# Patient Record
Sex: Male | Born: 1960 | Race: White | Hispanic: No | Marital: Married | Smoking: Former smoker
Health system: Southern US, Community
[De-identification: ages and names within clinical notes are randomized; demographics above are authoritative.]

## PROBLEM LIST (undated history)

## (undated) DIAGNOSIS — E7800 Pure hypercholesterolemia, unspecified: Secondary | ICD-10-CM

## (undated) DIAGNOSIS — I10 Essential (primary) hypertension: Secondary | ICD-10-CM

## (undated) DIAGNOSIS — E119 Type 2 diabetes mellitus without complications: Secondary | ICD-10-CM

## (undated) HISTORY — PX: THYROIDECTOMY: SHX17

## (undated) HISTORY — PX: TONSILLECTOMY: SUR1361

---

## 2005-05-17 ENCOUNTER — Ambulatory Visit: Payer: Self-pay | Admitting: Otolaryngology

## 2005-06-04 ENCOUNTER — Other Ambulatory Visit: Payer: Self-pay

## 2005-06-11 ENCOUNTER — Ambulatory Visit: Payer: Self-pay | Admitting: Otolaryngology

## 2005-06-18 ENCOUNTER — Ambulatory Visit: Payer: Self-pay | Admitting: Otolaryngology

## 2005-07-30 ENCOUNTER — Ambulatory Visit: Payer: Self-pay | Admitting: Otolaryngology

## 2005-09-04 ENCOUNTER — Inpatient Hospital Stay: Payer: Self-pay | Admitting: Endocrinology

## 2006-12-19 IMAGING — CR DG CHEST 2V
1 series · 2 of 2 positions shown · non-contrast
Comparison: none

REASON FOR EXAM: neck mass
COMMENTS:

[Series 1653: postero_anterior · 0.11mm/px · 2 of 2 slices shown]
[im 1/2]
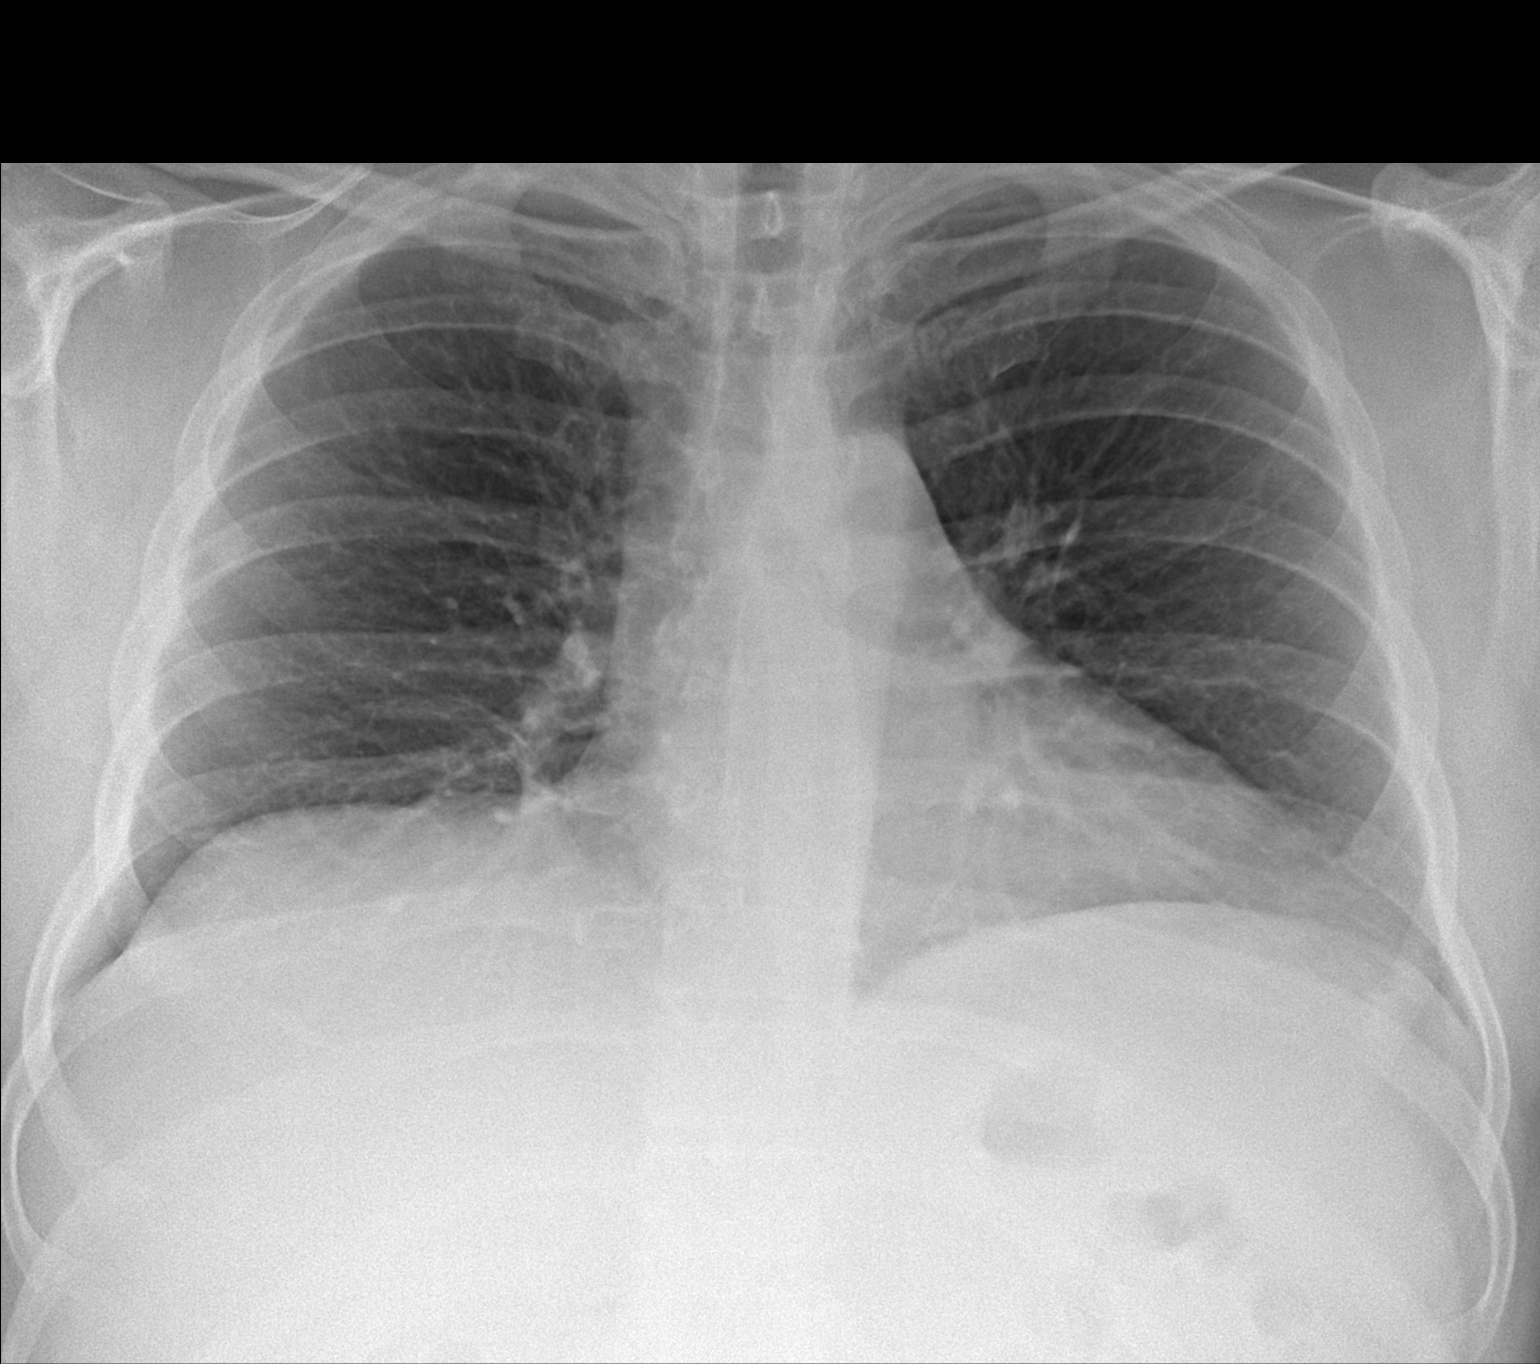
[im 2/2]
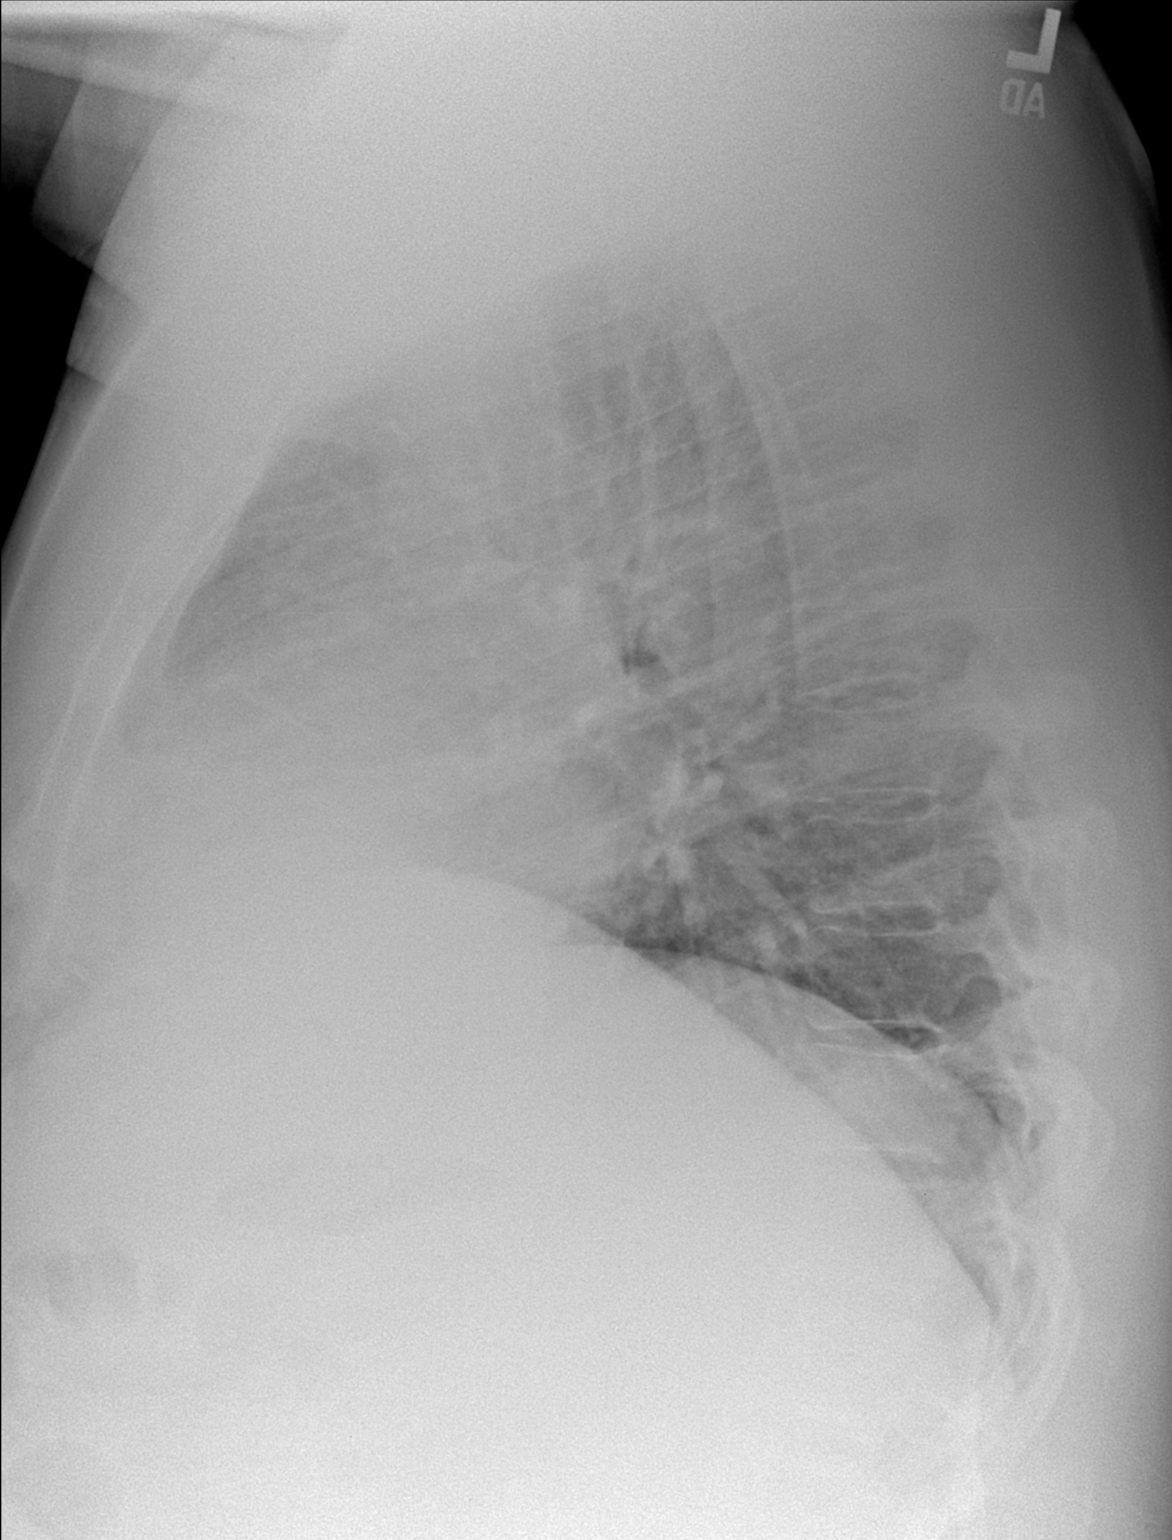

[2 of 2 positions shown; findings below may reference images not displayed]

PROCEDURE:     DXR - DXR CHEST PA (OR AP) AND LATERAL  - June 04, 2005  [DATE]

RESULT:     The lung fields are clear. No pneumonia, pneumothorax or pleural
effusion is seen. No mass or definite adenopathy is identified on plain film
examination. Heart size is normal. No significant osseous abnormalities are
noted.
IMPRESSION: 1)No significant abnormalities are identified.

## 2007-01-02 IMAGING — NM NM THYROID IMAGING W/ UPTAKE SINGLE (24 HR)
1 series · 3 of 3 positions shown · non-contrast
Comparison: none

REASON FOR EXAM: Neck CA. Evaluate for thyroid lesion and any metastatic
focus elsewhere in neck
COMMENTS:

[Series 0: thyroid · 2.4mm · 2.40mm/px · 3 of 3 frames shown]
[frame 1/3  full-range]
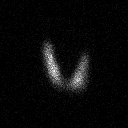
[frame 2/3  full-range]
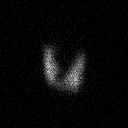
[frame 3/3  full-range]
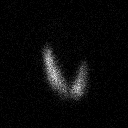

[3 of 3 positions shown; findings below may reference images not displayed]

PROCEDURE:     NM  - NM THYROID I-OLM 24 HR [DATE]  [DATE]

RESULT:     The patient received 139 microcuries of I-OLM orally for this
study.

The six-hour uptake is 11% and the 23-hour uptake is 22%.  The RIGHT thyroid
lobe appears to be slightly larger than the LEFT.  No hot or discrete cold
lesions are identified.  Within the visualized portions of the neck no
evidence of ectopic thyroid activity or metastatic foci is seen.
IMPRESSION: There is a normal I-OLM thyroid uptake and scan.  No findings are seen to
suggest ectopic thyroid tissue or metastatic disease within the visualized
portions of the neck.  The study received intradepartmental review.

## 2013-10-14 ENCOUNTER — Emergency Department: Payer: Self-pay | Admitting: Emergency Medicine

## 2013-10-14 LAB — COMPREHENSIVE METABOLIC PANEL
ALT: 21 U/L (ref 12–78)
ANION GAP: 6 — AB (ref 7–16)
Albumin: 4.4 g/dL (ref 3.4–5.0)
Alkaline Phosphatase: 65 U/L
BILIRUBIN TOTAL: 0.4 mg/dL (ref 0.2–1.0)
BUN: 16 mg/dL (ref 7–18)
CALCIUM: 9.7 mg/dL (ref 8.5–10.1)
Chloride: 104 mmol/L (ref 98–107)
Co2: 31 mmol/L (ref 21–32)
Creatinine: 1.14 mg/dL (ref 0.60–1.30)
EGFR (African American): >60
EGFR (Non-African Amer.): >60
Glucose: 80 mg/dL (ref 65–99)
OSMOLALITY: 281 (ref 275–301)
Potassium: 3.4 mmol/L — ABNORMAL LOW (ref 3.5–5.1)
SGOT(AST): 17 U/L (ref 15–37)
Sodium: 141 mmol/L (ref 136–145)
Total Protein: 8.2 g/dL (ref 6.4–8.2)

## 2013-10-14 LAB — URINALYSIS, COMPLETE
BLOOD: NEGATIVE
Bilirubin,UR: NEGATIVE
Glucose,UR: NEGATIVE mg/dL (ref 0–75)
Nitrite: NEGATIVE
PROTEIN: NEGATIVE
Ph: 5 (ref 4.5–8.0)
RBC,UR: 1 /HPF (ref 0–5)
Specific Gravity: 1.026 (ref 1.003–1.030)
Squamous Epithelial: <1
WBC UR: 5 /HPF (ref 0–5)

## 2013-10-14 LAB — CBC
HCT: 40.7 % (ref 40.0–52.0)
HGB: 13.8 g/dL (ref 13.0–18.0)
MCH: 30.3 pg (ref 26.0–34.0)
MCHC: 33.9 g/dL (ref 32.0–36.0)
MCV: 90 fL (ref 80–100)
Platelet: 261 10*3/uL (ref 150–440)
RBC: 4.55 10*6/uL (ref 4.40–5.90)
RDW: 14.1 % (ref 11.5–14.5)
WBC: 8.9 10*3/uL (ref 3.8–10.6)

## 2013-10-14 LAB — DRUG SCREEN, URINE
Amphetamines, Ur Screen: NEGATIVE (ref ?–1000)
Barbiturates, Ur Screen: NEGATIVE (ref ?–200)
Benzodiazepine, Ur Scrn: NEGATIVE (ref ?–200)
CANNABINOID 50 NG, UR ~~LOC~~: NEGATIVE (ref ?–50)
Cocaine Metabolite,Ur ~~LOC~~: POSITIVE (ref ?–300)
MDMA (ECSTASY) UR SCREEN: NEGATIVE (ref ?–500)
METHADONE, UR SCREEN: NEGATIVE (ref ?–300)
Opiate, Ur Screen: NEGATIVE (ref ?–300)
Phencyclidine (PCP) Ur S: NEGATIVE (ref ?–25)
TRICYCLIC, UR SCREEN: NEGATIVE (ref ?–1000)

## 2013-10-14 LAB — ETHANOL: Ethanol: <3 mg/dL

## 2013-10-14 LAB — SALICYLATE LEVEL: Salicylates, Serum: 1.9 mg/dL

## 2013-10-14 LAB — ACETAMINOPHEN LEVEL: Acetaminophen: <2 ug/mL

## 2013-12-22 DIAGNOSIS — E1122 Type 2 diabetes mellitus with diabetic chronic kidney disease: Secondary | ICD-10-CM | POA: Insufficient documentation

## 2013-12-22 DIAGNOSIS — E782 Mixed hyperlipidemia: Secondary | ICD-10-CM | POA: Insufficient documentation

## 2014-12-24 NOTE — Consult Note (Signed)
Brief Consult Note: Diagnosis: cocaine dependence.   Patient was seen by consultant.   Consult note dictated.   Discussed with Attending MD.   Comments: Psychiartry: PAtient seen and chart reviewed. Patient Addicted to cocaine. Denies any suicidal ideation and denies homicidal ideation and is calm and lucid. Not delirious or psychotic. Medically stable. Not commitable and not requiring inpatient treatment. Will refer to outpt SA treatment. DC the IVC.  Electronic Signatures: Clapacs, Jackquline DenmarkJohn T (MD)  (Signed 13-Feb-15 15:20)  Authored: Brief Consult Note   Last Updated: 13-Feb-15 15:20 by Audery Amellapacs, John T (MD)

## 2014-12-24 NOTE — Consult Note (Signed)
PATIENT NAME:  Randy Marshall, HARB[facility] MR#:  [id number] DATE OF BIRTH:  13-Dec-1960  DATE OF CONSULTATION:  10/15/2013  REFERRING PHYSICIAN:   CONSULTING PHYSICIAN:  Audery Amel, MD  IDENTIFYING INFORMATION AND REASON FOR CONSULTATION:  This is a 54 year old man with a history of cocaine dependence brought to the hospital under involuntary commitment.  Consultation for appropriate psychiatric disposition.   HISTORY OF PRESENT ILLNESS:  The patient's chief complaint to me is "I don't know why I'm here."  He was brought to the Emergency Room under involuntary commitment paperwork taken out by his wife.  The commitment paperwork simply says that he had made threats to himself and to his family.  Little other detail available.  I have not been able to reach his wife on the phone.  The patient admits that he abuses crack cocaine.  He tends to minimize it, however.  He says he smokes cocaine only occasionally, but admits that he had a binge recently.  He says that his wife has been after him to stop for quite some time and has frequently threatened that she was going to call the police and have him brought in.  The patient tends to blame his wife for his problems and says that he is very irritated that she so unrealistically thinks that a doctor can treat his cocaine addiction.  He totally denies any suicidal or homicidal ideation and scoffs at the idea that he has been threatening to anyone in his family.  Denies feeling depressed.  Denies any psychotic symptoms.   PAST PSYCHIATRIC HISTORY:  The patient has a history of cocaine dependence intermittently going back years.  He otherwise does not have any clear history of psychiatric treatment.  No psychiatric hospitalizations.  Denies any history of suicide attempts.  Denies any history of violence to others.  Not on any psychiatric medicine.   FAMILY HISTORY:  Apparently there was a brother who has a psychotic disorder.   SOCIAL HISTORY:  The patient is married,  has two young children.  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  Evidently that does not keep him very busy.  His wife also does not work and gets disability which seems to be what supports the family.  The patient sounds like he runs around with a lot of other people who are using drugs or getting in trouble frequently.   SUBSTANCE ABUSE HISTORY:  Intermittent abuse of cocaine going back years.  He has apparently never made any serious attempt to stay sober.  About the most he can come up with is that occasionally he has thought about stopping.  He insists that any time he tries to stop someone will get to talking about cocaine with him which immediately makes him think about using again.  He uses this as an excuse for why he keeps relapsing and why he does not want to engage in any treatment.   PAST MEDICAL HISTORY:  No significant ongoing medical problems other than hypothyroidism, mild and possible borderline diabetes.  He is not very knowledgeable about his medication or his treatment.   REVIEW OF SYSTEMS:  The patient denies depression.  Denies anxiety attacks.  Denies suicidal or homicidal ideation.  Denies hallucinations.  Absolutely denies having any threatening thoughts or behaviors.  Physically, he says he has some aches and pains, but otherwise a 10 point review of systems is negative.   MENTAL STATUS EXAMINATION:  Disheveled man who looks his stated age.  Passively cooperative with the interview.  Eye contact intermittent.  Psychomotor activity a little bit fidgety.  Speech normal rate, tone and volume.  Affect is entitled, slightly irritable, not bizarre, however.  Mood stated as fine.  Thoughts are lucid without any loosening of associations or delusions.  He engages in a great deal of blaming trying to actually blame his cocaine addiction on his family and his wife at times.  No evidence of psychosis.  No suicidal or homicidal ideation.  Insight and judgment  poor.  Intelligence normal.  Alert and oriented x 4.  Short and long-term memory grossly intact, 3 out of 3 objects immediately and at three minutes and good fund of knowledge.   LABORATORY RESULTS:  Salicylates and acetaminophen negative.  Alcohol negative.  Chemistry panel nothing remarkable.  CBC unremarkable.  Urinalysis, a little ketones.  Drug screen positive for cocaine.   ASSESSMENT:  This is a 54 year old man with cocaine dependence.  At the time of my interview there is no evidence of depression or mania.  No evidence of psychosis.  He absolutely denies any threats or wishes to harm himself or anyone else in his family.  He is able to cite multiple positive things in his life to live for.  There is no evidence that he has done anything violent to anyone.  He is able to understand the consequences of bad or dangerous behavior.  At this point, he does not appear to meet commitment criteria and can be taken off commitment.  The patient does have a cocaine dependence problem, however.   TREATMENT PLAN:  Discontinue involuntary commitment.  I spent some time trying to do a little bit of supportive interviewing and counseling with him and encouragement about stopping cocaine.  I informed him that while there was no pill that would make him stop using cocaine, there certainly was treatment for cocaine dependence available.  The patient will be given referrals to local mental health centers where substance abuse treatment can be obtained.  He seems to be only partially interested in stopping using drugs.  Otherwise, I think he can be released reasonably from the Emergency Room.  The case discussed with Emergency Room attending, involuntary commitment discontinued.  No new medicines prescribed.   DIAGNOSIS, PRINCIPAL AND PRIMARY:  AXIS I:  Cocaine dependence.   SECONDARY DIAGNOSES: AXIS I:  No further.  AXIS II:  Deferred.  AXIS III:  Borderline diabetes, high blood pressure, hypothyroidism.  AXIS  IV:  Severe stress from financial strain from cocaine dependence.  AXIS V:  Functioning at time of evaluation 54.    ____________________________ Audery AmelJohn T. Jametta Moorehead, MD jtc:ea D: 10/16/2013 00:29:31 ET T: 10/16/2013 04:33:42 ET JOB#: [id number]  cc: Audery AmelJohn T. Bradshaw Minihan, MD, <Dictator> Audery AmelJOHN T Ishia Tenorio MD ELECTRONICALLY SIGNED 10/19/2013 11:47

## 2017-04-28 DIAGNOSIS — Z85850 Personal history of malignant neoplasm of thyroid: Secondary | ICD-10-CM | POA: Insufficient documentation

## 2018-07-03 DIAGNOSIS — B1710 Acute hepatitis C without hepatic coma: Secondary | ICD-10-CM | POA: Insufficient documentation

## 2018-07-06 DIAGNOSIS — Z79899 Other long term (current) drug therapy: Secondary | ICD-10-CM | POA: Insufficient documentation

## 2018-07-27 DIAGNOSIS — R7989 Other specified abnormal findings of blood chemistry: Secondary | ICD-10-CM | POA: Insufficient documentation

## 2018-07-28 ENCOUNTER — Other Ambulatory Visit: Payer: Self-pay | Admitting: Nurse Practitioner

## 2018-07-28 DIAGNOSIS — R945 Abnormal results of liver function studies: Principal | ICD-10-CM

## 2018-07-28 DIAGNOSIS — R7989 Other specified abnormal findings of blood chemistry: Secondary | ICD-10-CM

## 2018-07-29 ENCOUNTER — Ambulatory Visit: Admission: RE | Admit: 2018-07-29 | Payer: Commercial Managed Care - PPO | Source: Ambulatory Visit

## 2018-07-29 ENCOUNTER — Other Ambulatory Visit: Payer: Self-pay | Admitting: Gastroenterology

## 2018-07-29 DIAGNOSIS — R748 Abnormal levels of other serum enzymes: Secondary | ICD-10-CM

## 2018-08-04 ENCOUNTER — Encounter (INDEPENDENT_AMBULATORY_CARE_PROVIDER_SITE_OTHER): Payer: Self-pay

## 2018-08-04 ENCOUNTER — Ambulatory Visit
Admission: RE | Admit: 2018-08-04 | Discharge: 2018-08-04 | Disposition: A | Discharge status: Home / Self Care | Payer: Commercial Managed Care - PPO | Source: Ambulatory Visit | Attending: Gastroenterology | Admitting: Gastroenterology

## 2018-08-04 DIAGNOSIS — R748 Abnormal levels of other serum enzymes: Secondary | ICD-10-CM | POA: Insufficient documentation

## 2018-08-04 DIAGNOSIS — K7689 Other specified diseases of liver: Secondary | ICD-10-CM | POA: Insufficient documentation

## 2018-08-05 ENCOUNTER — Other Ambulatory Visit: Payer: Self-pay | Admitting: Gastroenterology

## 2018-08-05 DIAGNOSIS — R7989 Other specified abnormal findings of blood chemistry: Secondary | ICD-10-CM

## 2018-08-05 DIAGNOSIS — R945 Abnormal results of liver function studies: Principal | ICD-10-CM

## 2018-08-06 ENCOUNTER — Ambulatory Visit
Admission: RE | Admit: 2018-08-06 | Discharge: 2018-08-06 | Disposition: A | Discharge status: Home / Self Care | Payer: Commercial Managed Care - PPO | Source: Ambulatory Visit | Attending: Gastroenterology | Admitting: Gastroenterology

## 2018-08-06 DIAGNOSIS — R748 Abnormal levels of other serum enzymes: Secondary | ICD-10-CM | POA: Diagnosis not present

## 2018-08-06 DIAGNOSIS — R7989 Other specified abnormal findings of blood chemistry: Secondary | ICD-10-CM

## 2018-08-06 DIAGNOSIS — R945 Abnormal results of liver function studies: Secondary | ICD-10-CM

## 2018-09-04 DIAGNOSIS — R945 Abnormal results of liver function studies: Secondary | ICD-10-CM | POA: Diagnosis not present

## 2019-07-18 IMAGING — US US HEPATIC LIVER DOPPLER
1 series · 14 of 25 positions shown · non-contrast
Comparison: None.

CLINICAL DATA: 57-year-old male with history transaminitis

EXAM:
DUPLEX ULTRASOUND OF LIVER
TECHNIQUE: Color and duplex Doppler ultrasound was performed to evaluate the
hepatic in-flow and out-flow vessels.

[Series 1: us hepatic liver doppler · 0.26mm/px · 14 of 51 slices shown]
[im 1/51]
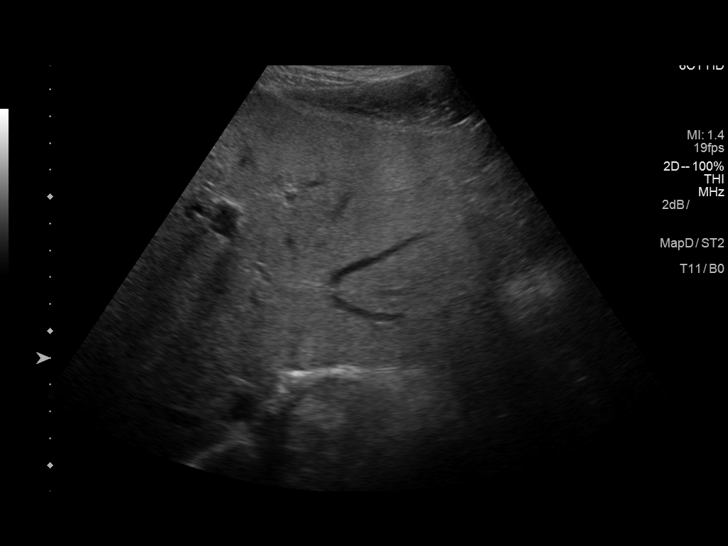
[im 5/51]
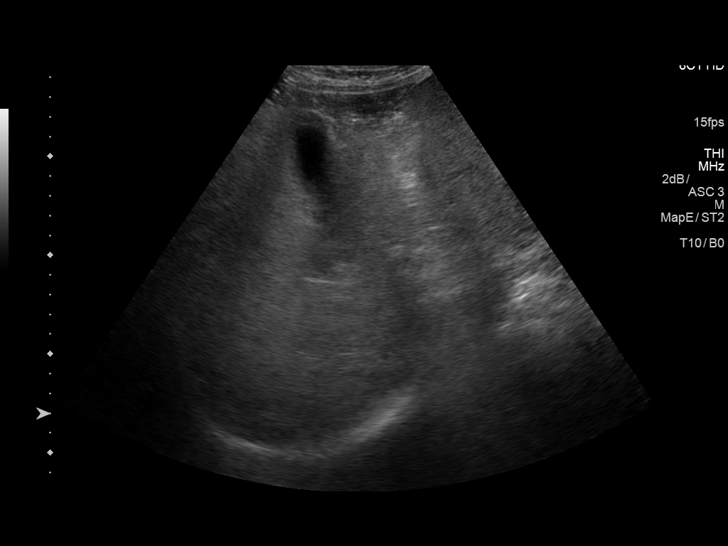
[im 9/51]
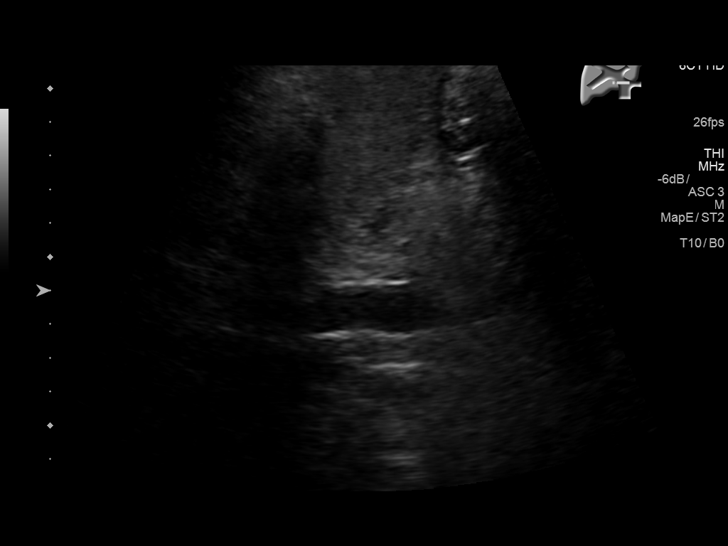
[im 13/51]
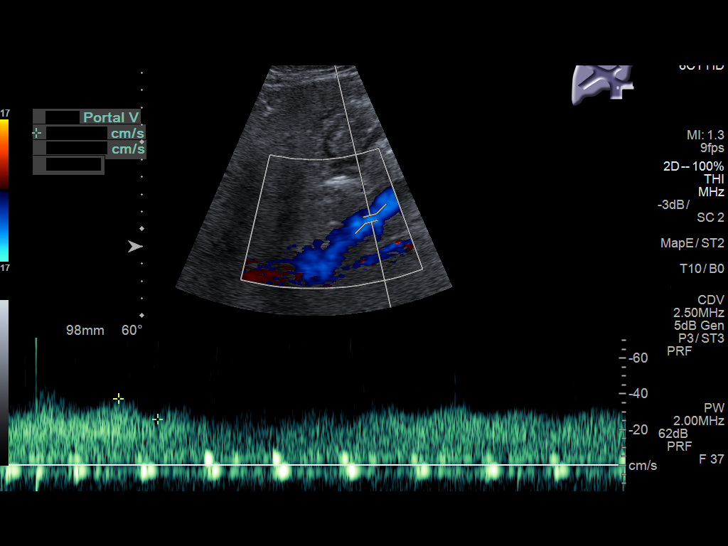
[im 17/51]
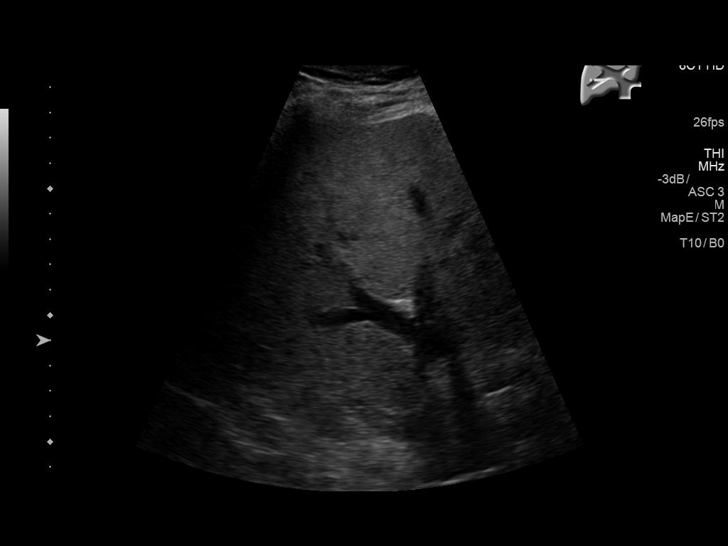
[im 19/51]
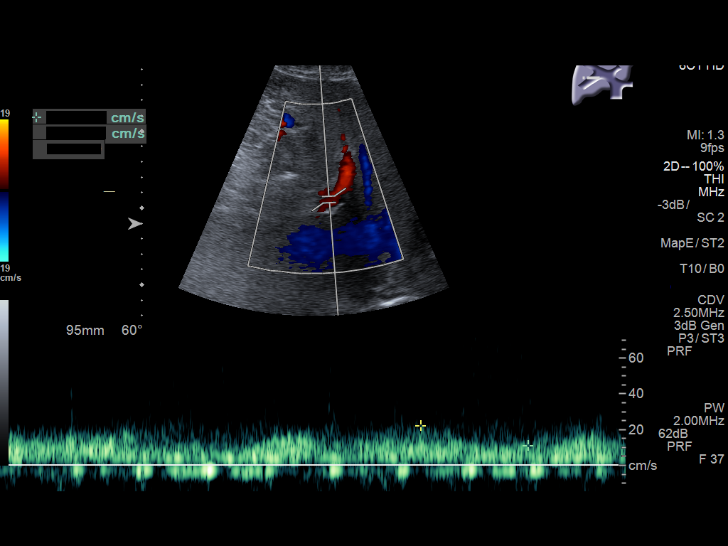
[im 23/51]
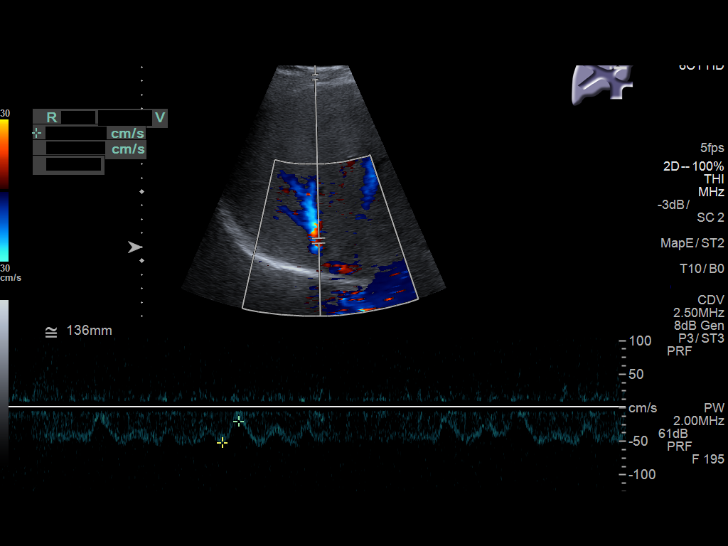
[im 28/51]
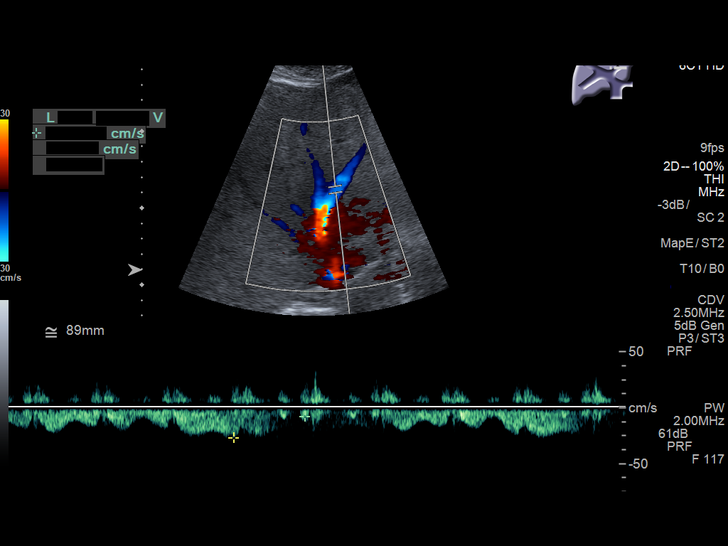
[im 32/51]
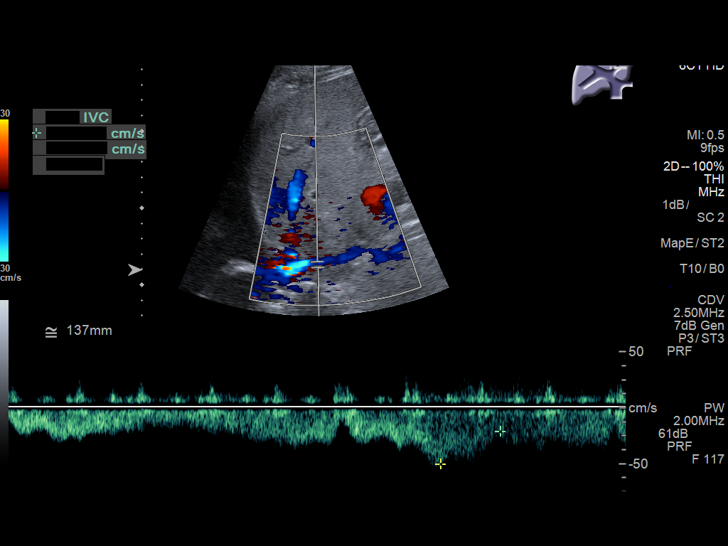
[im 34/51]
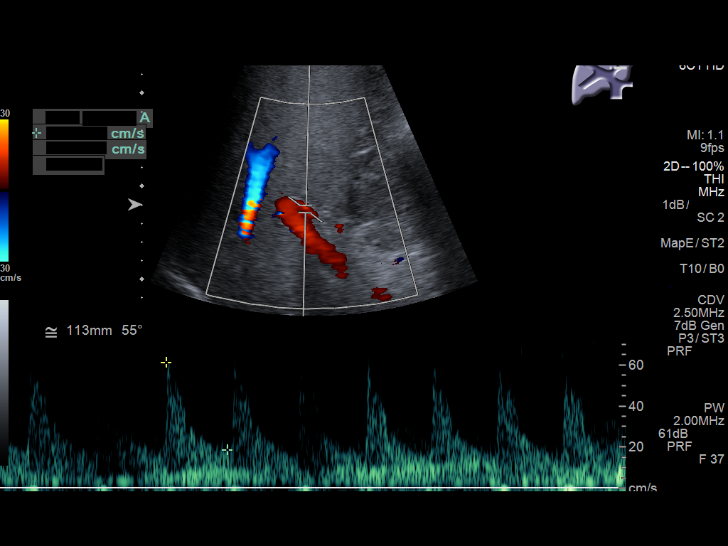
[im 38/51]
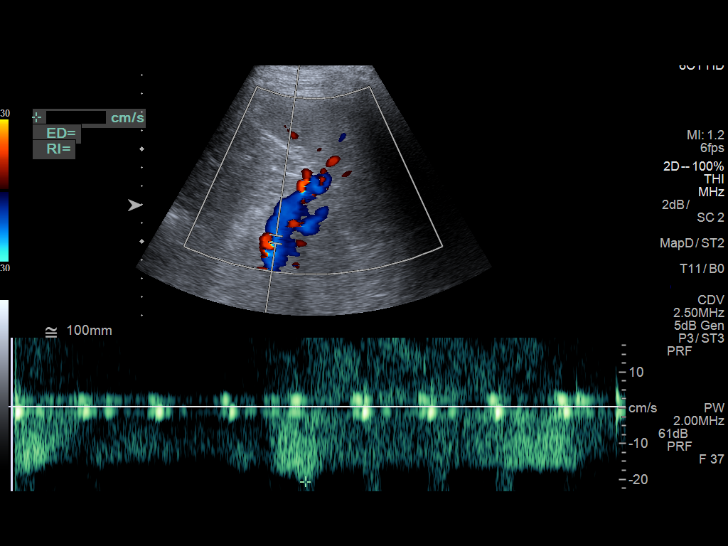
[im 42/51]
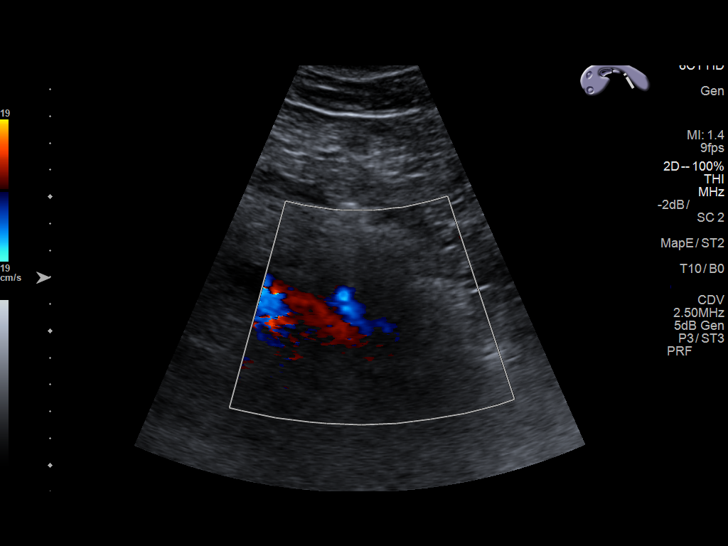
[im 46/51]
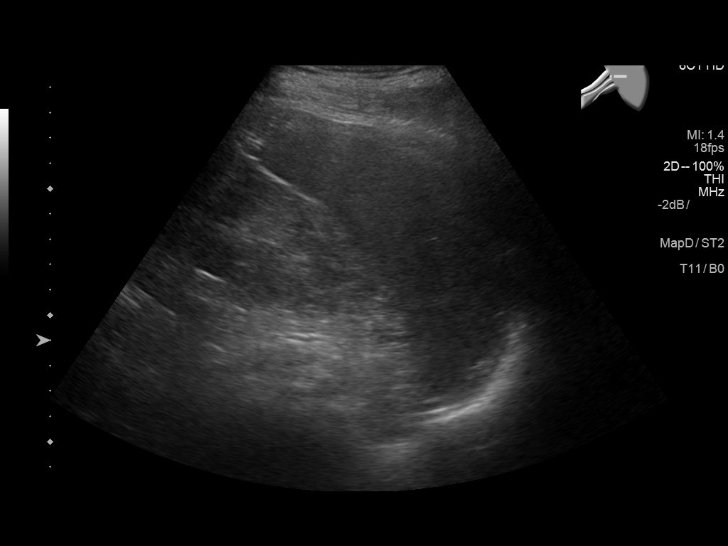
[im 51/51]
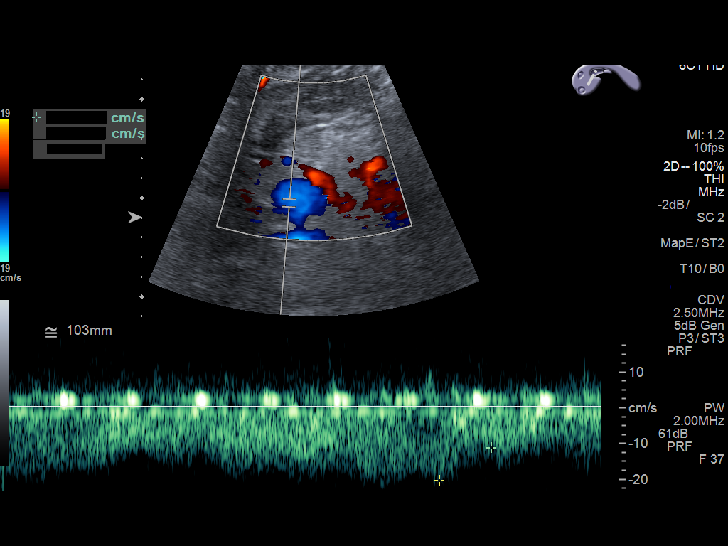

[14 of 25 positions shown; findings below may reference images not displayed]

FINDINGS: Portal Vein Velocities

Main:  37 cm/sec

Right:  27 cm/sec

Left:  28 cm/sec

Hepatic Vein Velocities

Right:  52 cm/sec

Middle:  28 cm/sec

Left:  27 cm/sec

Hepatic Artery Velocity:  61 cm/sec

Splenic Vein Velocity:  21 cm/sec

Varices: Absent

Ascites: Absent

Spleen estimated 202 cc

Cyst identified on prior ultrasound not visualized.

Heterogeneous echotexture of liver parenchyma with no nodular
contour.
IMPRESSION: Patent portal venous system with hepatopetal flow.

Heterogeneous appearance of liver parenchyma, which can be seen in
medical liver disease.

## 2020-12-19 ENCOUNTER — Ambulatory Visit
Admission: EM | Admit: 2020-12-19 | Discharge: 2020-12-19 | Disposition: A | Discharge status: Home / Self Care | Payer: Commercial Managed Care - PPO | Attending: Emergency Medicine | Admitting: Emergency Medicine

## 2020-12-19 ENCOUNTER — Encounter: Payer: Self-pay | Admitting: Emergency Medicine

## 2020-12-19 ENCOUNTER — Other Ambulatory Visit: Payer: Self-pay

## 2020-12-19 DIAGNOSIS — R339 Retention of urine, unspecified: Secondary | ICD-10-CM | POA: Insufficient documentation

## 2020-12-19 HISTORY — DX: Essential (primary) hypertension: I10

## 2020-12-19 HISTORY — DX: Type 2 diabetes mellitus without complications: E11.9

## 2020-12-19 HISTORY — DX: Pure hypercholesterolemia, unspecified: E78.00

## 2020-12-19 LAB — URINALYSIS, COMPLETE (UACMP) WITH MICROSCOPIC
Bilirubin Urine: NEGATIVE
Glucose, UA: 250 mg/dL — AB
Hgb urine dipstick: NEGATIVE
Ketones, ur: NEGATIVE mg/dL
Leukocytes,Ua: NEGATIVE
Nitrite: NEGATIVE
Protein, ur: NEGATIVE mg/dL
Specific Gravity, Urine: 1.02 (ref 1.005–1.030)
pH: 5.5 (ref 5.0–8.0)

## 2020-12-19 LAB — COMPREHENSIVE METABOLIC PANEL
ALT: 85 U/L — ABNORMAL HIGH (ref 0–44)
AST: 33 U/L (ref 15–41)
Albumin: 3.9 g/dL (ref 3.5–5.0)
Alkaline Phosphatase: 50 U/L (ref 38–126)
Anion gap: 8 (ref 5–15)
BUN: 23 mg/dL — ABNORMAL HIGH (ref 6–20)
CO2: 26 mmol/L (ref 22–32)
Calcium: 9.1 mg/dL (ref 8.9–10.3)
Chloride: 103 mmol/L (ref 98–111)
Creatinine, Ser: 0.85 mg/dL (ref 0.61–1.24)
GFR, Estimated: >60 mL/min (ref >60)
Glucose, Bld: 94 mg/dL (ref 70–99)
Potassium: 3.9 mmol/L (ref 3.5–5.1)
Sodium: 137 mmol/L (ref 135–145)
Total Bilirubin: 0.5 mg/dL (ref 0.3–1.2)
Total Protein: 7.6 g/dL (ref 6.5–8.1)

## 2020-12-19 MED ORDER — TAMSULOSIN HCL 0.4 MG PO CAPS: 0.4000 mg | ORAL_CAPSULE | Freq: Every day | ORAL | 0 refills | Status: DC

## 2020-12-19 NOTE — ED Provider Notes (Signed)
[facility]    CSN: [id number] Arrival date & time: 12/19/20  0850      History   Chief Complaint Chief Complaint  Patient presents with  . Urinary Retention    HPI Randy Marshall is a 60 y.o. male.   HPI   60 year old male here for evaluation of urinary symptoms.  Patient reports that for the last 3 to 4 days he has been having issues with urinary urgency, frequency, and a weak stream.  Patient reports that he will feel like he has to urinate and then only a trickle will come out.  He denies fever, chills, back pain, blood in his urine, cloudiness to his urine, painful urination, or any history of prostate issues in the past.  Patient does have a history of diabetes, hypertension, and CKD stage III.  Patient reports that his symptoms have become better today and he is wondering if he may be was not drinking enough water.  Past Medical History:  Diagnosis Date  . Diabetes mellitus without complication (HCC)   . Hypercholesteremia   . Hypertension     There are no problems to display for this patient.   Past Surgical History:  Procedure Laterality Date  . THYROIDECTOMY    . TONSILLECTOMY         Home Medications    Prior to Admission medications   Medication Sig Start Date End Date Taking? Authorizing Provider  amLODipine (NORVASC) 10 MG tablet Take by mouth. 11/28/20  Yes [provider]  Cholecalciferol 50 MCG (2000 UT) CAPS Take by mouth. 08/23/19  Yes [provider]  cyanocobalamin 1000 MCG tablet Take by mouth. 08/20/19  Yes [provider]  glimepiride (AMARYL) 1 MG tablet Take 1 mg by mouth 2 (two) times daily. 09/07/20  Yes [provider]  levothyroxine (SYNTHROID) 150 MCG tablet Take 150 mcg by mouth daily. 11/06/20  Yes [provider]  lisinopril (ZESTRIL) 40 MG tablet Take 40 mg by mouth daily. 11/06/20  Yes [provider]  metFORMIN (GLUCOPHAGE) 1000 MG tablet Take 1 tablet by mouth 2 (two)  times daily. 10/31/20  Yes [provider]  Omega-3 Fatty Acids (FISH OIL) 1000 MG CAPS Take by mouth.   Yes [provider]  rosuvastatin (CRESTOR) 40 MG tablet Take 40 mg by mouth daily. 10/31/20  Yes [provider]  tamsulosin (FLOMAX) 0.4 MG CAPS capsule Take 1 capsule (0.4 mg total) by mouth daily. 12/19/20  Yes Becky Augusta, NP    Family History History reviewed. No pertinent family history.  Social History Social History   Tobacco Use  . Smoking status: Former Games developer  . Smokeless tobacco: Never Used  Vaping Use  . Vaping Use: Never used  Substance Use Topics  . Alcohol use: Not Currently  . Drug use: Never     Allergies   Patient has no known allergies.   Review of Systems Review of Systems  Constitutional: Negative for activity change, appetite change, chills and fever.  Gastrointestinal: Negative for abdominal pain.  Genitourinary: Positive for decreased urine volume, difficulty urinating, frequency and urgency. Negative for dysuria, flank pain, hematuria and penile discharge.  Musculoskeletal: Negative for back pain.  Hematological: Negative.   Psychiatric/Behavioral: Negative.      Physical Exam Triage Vital Signs ED Triage Vitals [12/19/20 0901]  Enc Vitals Group     BP      Pulse      Resp      Temp  Temp src      SpO2      Weight      Height      Head Circumference      Peak Flow      Pain Score 0     Pain Loc      Pain Edu?      Excl. in GC?    No data found.  Updated Vital Signs BP (!) 161/84 (BP Location: Left Arm)   Pulse 87   Temp 99.3 F (37.4 C) (Oral)   Resp 18   Ht 5\' 7"  (1.702 m)   Wt 200 lb (90.7 kg)   SpO2 99%   BMI 31.32 kg/m   Visual Acuity Right Eye Distance:   Left Eye Distance:   Bilateral Distance:    Right Eye Near:   Left Eye Near:    Bilateral Near:     Physical Exam Vitals and nursing note reviewed.  Constitutional:      General: He is not in acute distress.     Appearance: Normal appearance. He is obese. He is not ill-appearing.  HENT:     Head: Normocephalic and atraumatic.  Cardiovascular:     Rate and Rhythm: Normal rate and regular rhythm.     Pulses: Normal pulses.     Heart sounds: Normal heart sounds. No murmur heard. No gallop.   Pulmonary:     Effort: Pulmonary effort is normal.     Breath sounds: Normal breath sounds. No wheezing, rhonchi or rales.  Abdominal:     General: Bowel sounds are normal. There is no distension.     Palpations: Abdomen is soft.     Tenderness: There is no abdominal tenderness. There is no right CVA tenderness, left CVA tenderness, guarding or rebound.  Genitourinary:    Prostate: Normal.     Rectum: Normal.  Skin:    General: Skin is warm and dry.     Capillary Refill: Capillary refill takes less than 2 seconds.     Findings: No erythema or rash.  Neurological:     General: No focal deficit present.     Mental Status: He is alert and oriented to person, place, and time.  Psychiatric:        Mood and Affect: Mood normal.        Behavior: Behavior normal.        Thought Content: Thought content normal.        Judgment: Judgment normal.      UC Treatments / Results  Labs (all labs ordered are listed, but only abnormal results are displayed) Labs Reviewed  URINALYSIS, COMPLETE (UACMP) WITH MICROSCOPIC - Abnormal; Notable for the following components:      Result Value   Glucose, UA 250 (*)    Bacteria, UA RARE (*)    All other components within normal limits  COMPREHENSIVE METABOLIC PANEL - Abnormal; Notable for the following components:   BUN 23 (*)    ALT 85 (*)    All other components within normal limits    EKG   Radiology No results found.  Procedures Procedures (including critical care time)  Medications Ordered in UC Medications - No data to display  Initial Impression / Assessment and Plan / UC Course  I have reviewed the triage vital signs and the nursing  notes.  Pertinent labs & imaging results that were available during my care of the patient were reviewed by me and considered in my medical decision making (see  chart for details).   Patient is a very pleasant 60 year old male here for evaluation of urinary symptoms that been going on for last 3 to 4 days.  He ports that he feels like he has to urinate but has difficulty starting his stream, stream is weak, and only a little bit will come out.  He does not report any feelings of incomplete emptying but he does report urgency and frequency of urination.  He denies fever, hematuria, cloudiness to his urine, or pain with urination.  Patient states he has not had any prostate issues in the past but he does have CKD stage III.  Patient has mild dyspnea on exam but his lungs are clear to auscultation all fields.  Heart sounds are S1-S2 without murmur, rub, or ectopy.  Abdomen is protuberant but soft with positive bowel sounds in all 4 quadrants.  Patient has no tenderness to palpation.  Unable to palpate bladder or detect any bladder distention.  Rectal exam reveals a nontender, nonswollen prostate.  No gross blood.  No hemorrhoids noted on exam.  Will check urinalysis for signs of infection.  If urine is negative will check renal function.  UA shows a glucose of 250 and rare bacteria but is otherwise unremarkable.  Will check CMP.  CMP showed no electrolyte abnormality, BUN is 23 and creatinine is 0.85.  There is no signs of AKI or impaired renal function and there is no signs of urinary tract infection based on lab work.  Patient's prostate exam does not reveal any tenderness or significant enlargement.  We will do a trial of tamsulosin 0.4 mg daily to see if patient has an improvement in his symptoms.  Patient's calculated creatinine clearance is 119 mL/min so there is no dosage adjustment required.   Final Clinical Impressions(s) / UC Diagnoses   Final diagnoses:  Urinary retention     Discharge  Instructions     Your lab work today did not show any signs of urinary tract infection, or decreased to your kidney function.  Your physical exam did not reveal the presence of a tender prostate and I do not suspect prostatitis but I do suspect that you have some mild enlargement of your prostate which is leading to your urinary symptoms.  Start taking tamsulosin 1 capsule daily.  Since you work nights I would take this when you get up to go to work and continued on that pattern.  If your symptoms improve you can talk to your primary care provider about continuing the tamsulosin.  If your symptoms do not improve, or new or worsening symptoms develop, return for reevaluation or see your primary care provider.    ED Prescriptions    Medication Sig Dispense Auth. Provider   tamsulosin (FLOMAX) 0.4 MG CAPS capsule Take 1 capsule (0.4 mg total) by mouth daily. 30 capsule Becky Augusta, NP     PDMP not reviewed this encounter.   Becky Augusta, NP 12/19/20 1029

## 2020-12-19 NOTE — Discharge Instructions (Addendum)
Your lab work today did not show any signs of urinary tract infection, or decreased to your kidney function.  Your physical exam did not reveal the presence of a tender prostate and I do not suspect prostatitis but I do suspect that you have some mild enlargement of your prostate which is leading to your urinary symptoms.  Start taking tamsulosin 1 capsule daily.  Since you work nights I would take this when you get up to go to work and continued on that pattern.  If your symptoms improve you can talk to your primary care provider about continuing the tamsulosin.  If your symptoms do not improve, or new or worsening symptoms develop, return for reevaluation or see your primary care provider.

## 2020-12-19 NOTE — ED Triage Notes (Signed)
Pt c/o urinary retention for several days. Pt states he has the urge to go but it only trickles out. Pt denies pain, burning, hematuria, color or odor change, bubbles or any other symptoms.

## 2021-03-29 ENCOUNTER — Other Ambulatory Visit: Payer: Self-pay | Admitting: Gerontology

## 2021-03-29 DIAGNOSIS — R222 Localized swelling, mass and lump, trunk: Secondary | ICD-10-CM

## 2021-04-04 ENCOUNTER — Ambulatory Visit: Payer: Commercial Managed Care - PPO

## 2021-04-16 ENCOUNTER — Ambulatory Visit: Payer: Commercial Managed Care - PPO

## 2021-04-25 ENCOUNTER — Ambulatory Visit: Payer: Commercial Managed Care - PPO

## 2021-04-27 ENCOUNTER — Ambulatory Visit
Admission: RE | Admit: 2021-04-27 | Discharge: 2021-04-27 | Disposition: A | Discharge status: Home / Self Care | Payer: Commercial Managed Care - PPO | Source: Ambulatory Visit | Attending: Gerontology | Admitting: Gerontology

## 2021-04-27 ENCOUNTER — Other Ambulatory Visit: Payer: Self-pay

## 2021-04-27 DIAGNOSIS — R222 Localized swelling, mass and lump, trunk: Secondary | ICD-10-CM | POA: Diagnosis not present

## 2021-10-25 DIAGNOSIS — G4733 Obstructive sleep apnea (adult) (pediatric): Secondary | ICD-10-CM | POA: Insufficient documentation

## 2022-08-29 ENCOUNTER — Ambulatory Visit: Admit: 2022-08-29 | Payer: Commercial Managed Care - PPO | Admitting: Gastroenterology

## 2022-08-29 SURGERY — COLONOSCOPY WITH PROPOFOL
Anesthesia: General

## 2022-11-11 IMAGING — US US SOFT TISSUE
1 series · 14 of 14 positions shown · non-contrast
Comparison: None.

CLINICAL DATA: Protrusion of the left upper ribcage compared to the
right.

EXAM:
ULTRASOUND OF CHEST SOFT TISSUES
TECHNIQUE: Ultrasound examination of the chest wall soft tissues was performed
in the area of clinical concern.

[Series 1: us soft tissue · 0.07mm/px · 14 of 14 slices shown]
[im 1/14]
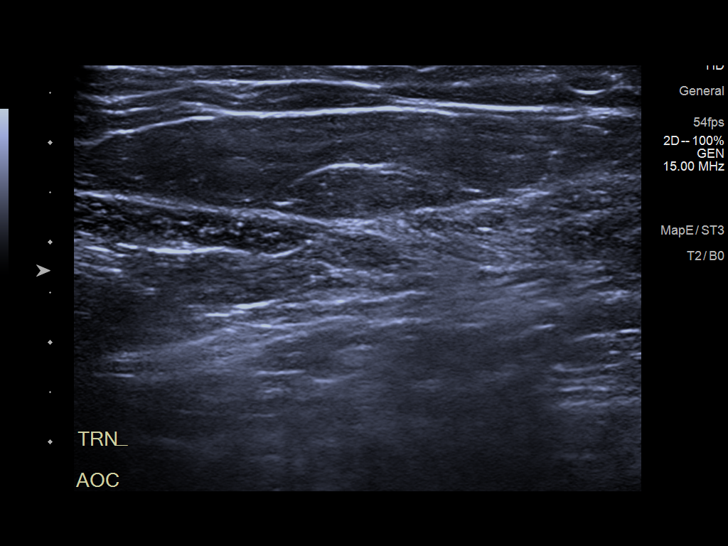
[im 2/14]
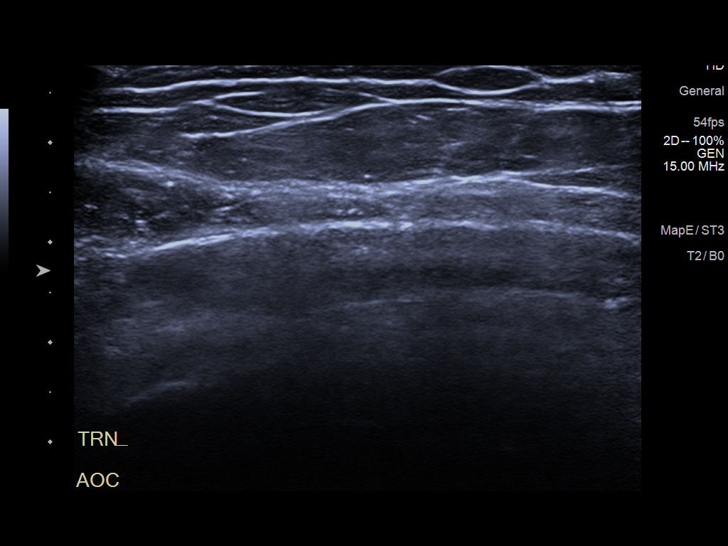
[im 3/14]
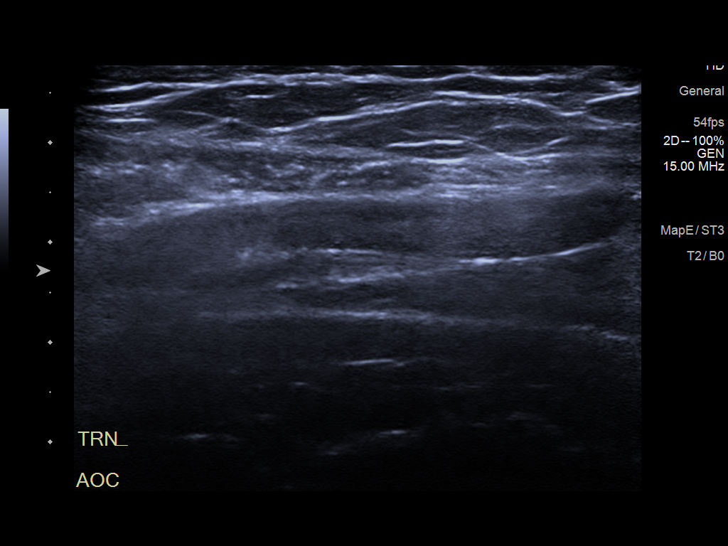
[im 4/14]
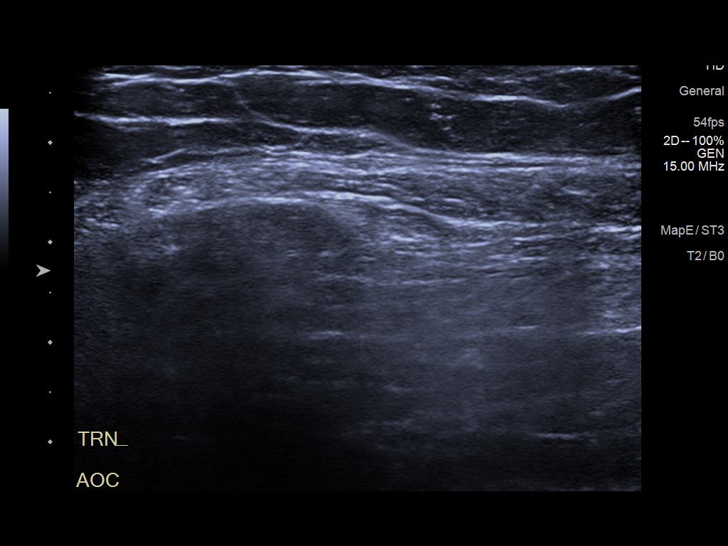
[im 5/14]
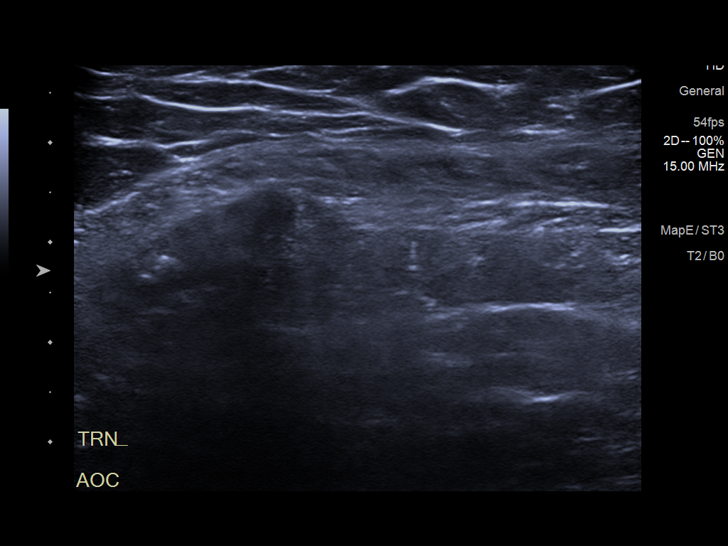
[im 6/14]
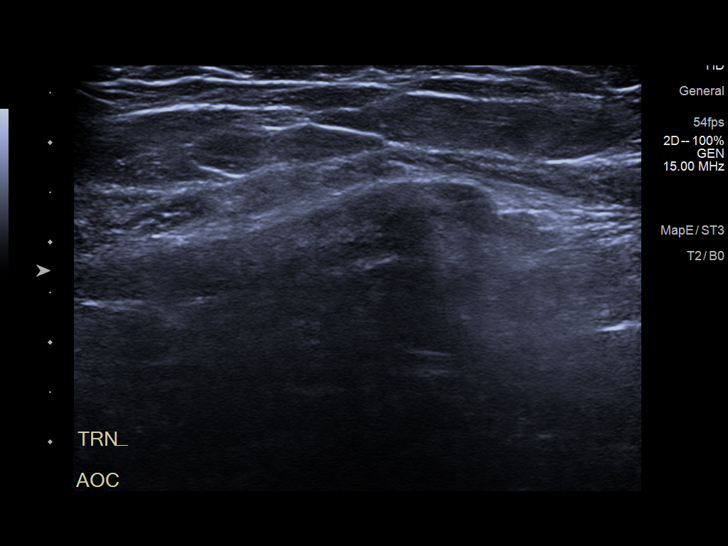
[im 7/14]
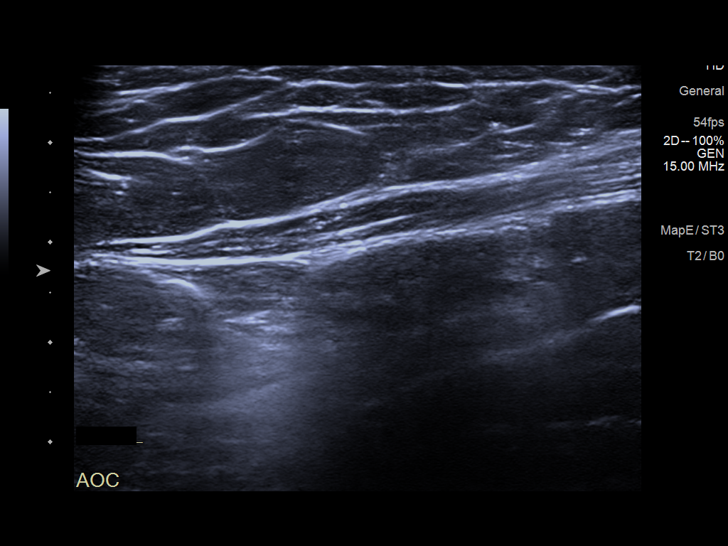
[im 8/14]
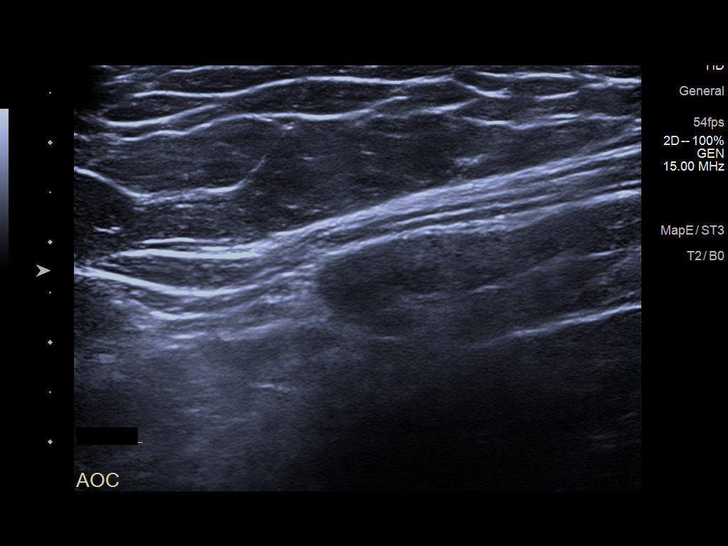
[im 9/14]
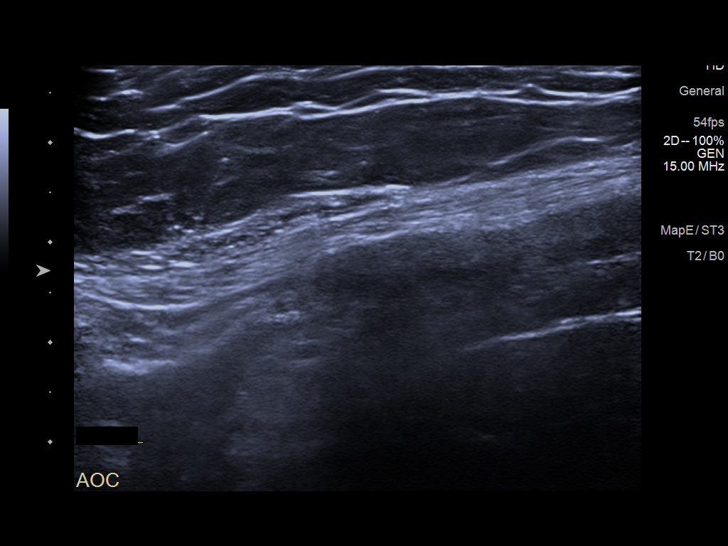
[im 10/14]
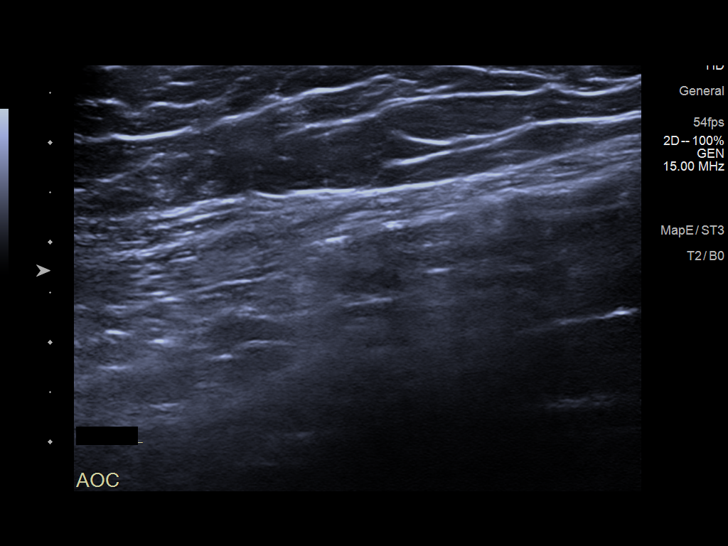
[im 11/14]
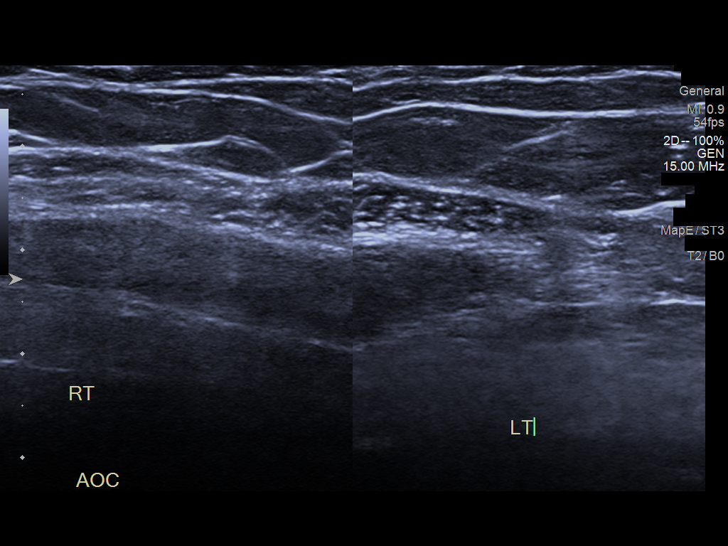
[im 12/14]
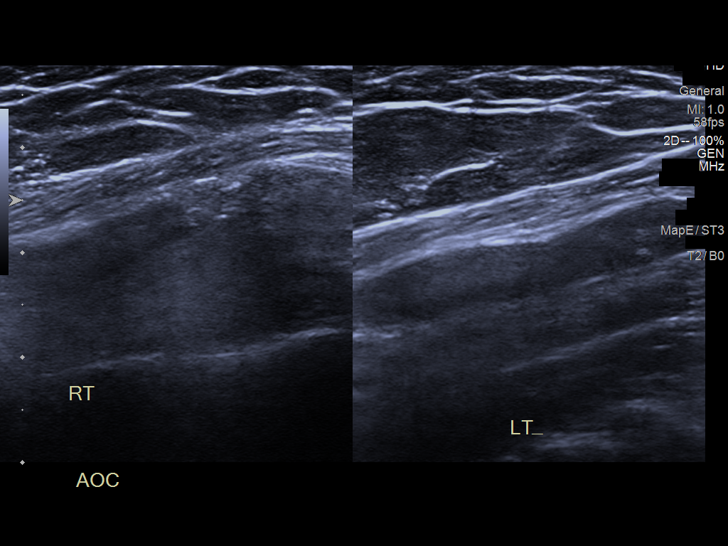
[im 13/14]
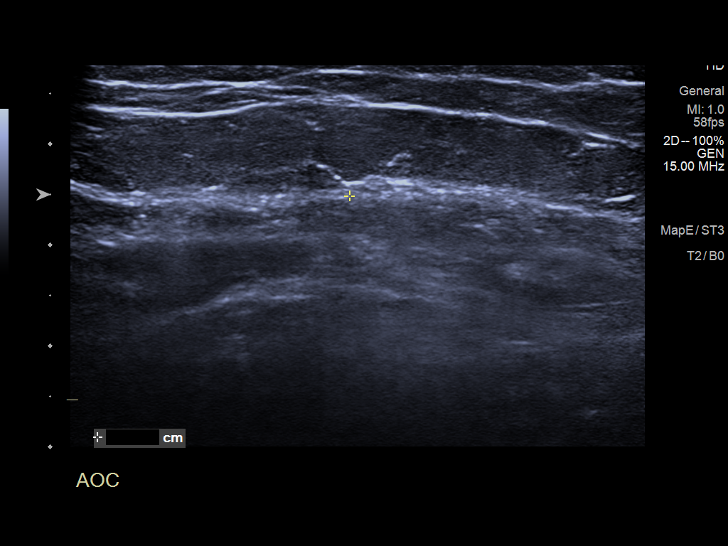
[im 14/14]
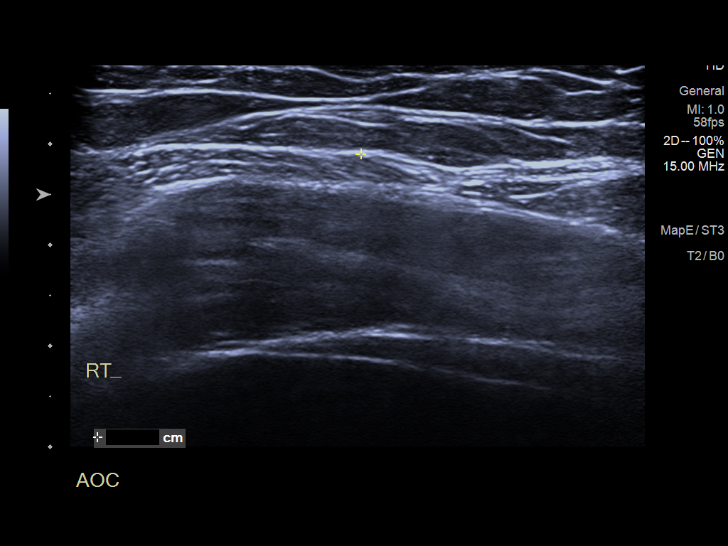

[14 of 14 positions shown; findings below may reference images not displayed]

FINDINGS: Targeted ultrasound was performed of the bilateral upper
quadrants/rib cage below the nipples in the area of clinical
concern. No suspicious solid or cystic mass is identified.
Subcutaneous fat may be asymmetric on the left relative to the right
and may explain the patient's symptoms.
IMPRESSION: Normal appearing subcutaneous fat in the area of clinical concern.
Asymmetric subcutaneous fat between the left and the right may
explain the patient's symptoms.

## 2023-03-28 LAB — EXTERNAL GENERIC LAB PROCEDURE: COLOGUARD: NEGATIVE

## 2023-03-28 LAB — COLOGUARD: COLOGUARD: NEGATIVE

## 2023-06-18 ENCOUNTER — Other Ambulatory Visit: Payer: Self-pay | Admitting: Gastroenterology

## 2023-06-18 DIAGNOSIS — B182 Chronic viral hepatitis C: Secondary | ICD-10-CM

## 2023-06-24 ENCOUNTER — Ambulatory Visit
Admission: RE | Admit: 2023-06-24 | Discharge: 2023-06-24 | Disposition: A | Discharge status: Home / Self Care | Payer: Commercial Managed Care - PPO | Source: Ambulatory Visit | Attending: Gastroenterology | Admitting: Gastroenterology

## 2023-06-24 DIAGNOSIS — B182 Chronic viral hepatitis C: Secondary | ICD-10-CM | POA: Diagnosis present

## 2023-08-04 ENCOUNTER — Ambulatory Visit (INDEPENDENT_AMBULATORY_CARE_PROVIDER_SITE_OTHER): Payer: Commercial Managed Care - PPO | Admitting: Internal Medicine

## 2023-08-04 VITALS — BP 141/83 | HR 76 | Resp 16 | Ht 67.0 in | Wt 223.0 lb

## 2023-08-04 DIAGNOSIS — Z7189 Other specified counseling: Secondary | ICD-10-CM | POA: Insufficient documentation

## 2023-08-04 DIAGNOSIS — I10 Essential (primary) hypertension: Secondary | ICD-10-CM | POA: Insufficient documentation

## 2023-08-04 DIAGNOSIS — G4733 Obstructive sleep apnea (adult) (pediatric): Secondary | ICD-10-CM | POA: Insufficient documentation

## 2023-08-04 NOTE — Progress Notes (Unsigned)
[facility] [address] [place], [place] [address]  Pulmonary Sleep Medicine   Office Visit Note  Patient Name: Randy Marshall DOB: 12-06-1960 MRN [id number]    Chief Complaint: Obstructive Sleep Apnea visit  Brief History:  Randy Marshall is seen today for an initial consult for APAP@ 5-20 cmH2O. The patient has a 1.75 year history of sleep apnea. Patient is using PAP nightly.  The patient feels rested after sleeping with PAP.  The patient reports benefiting from PAP use. Reported sleepiness is  improved and the Epworth Sleepiness Score is 3 out of 24. The patient does not take naps. The patient complains of the following: none.  The compliance download shows 100% compliance with an average use time of 8 hours 55 minutes. The AHI is 0.8.  The patient does not complain of limb movements disrupting sleep. The patient continues to require PAP therapy in order to eliminate sleep apnea.   ROS  General: (-) fever, (-) chills, (-) night sweat Nose and Sinuses: (-) nasal stuffiness or itchiness, (-) postnasal drip, (-) nosebleeds, (-) sinus trouble. Mouth and Throat: (-) sore throat, (-) hoarseness. Neck: (-) swollen glands, (-) enlarged thyroid, (-) neck pain. Respiratory: - cough, - shortness of breath, - wheezing. Neurologic: - numbness, - tingling. Psychiatric: - anxiety, - depression   Current Medication: Outpatient Encounter Medications as of 08/04/2023  Medication Sig   levothyroxine (SYNTHROID) 137 MCG tablet Take by mouth.   amLODipine (NORVASC) 10 MG tablet Take by mouth.   Cholecalciferol 50 MCG (2000 UT) CAPS Take by mouth.   cyanocobalamin 1000 MCG tablet Take by mouth.   glimepiride (AMARYL) 1 MG tablet Take 1 mg by mouth 2 (two) times daily.   lisinopril (ZESTRIL) 40 MG tablet Take 40 mg by mouth daily.   metFORMIN (GLUCOPHAGE) 1000 MG tablet Take 1 tablet by mouth 2 (two) times daily.   Omega-3 Fatty Acids (FISH OIL) 1000 MG CAPS Take by mouth.   rosuvastatin  (CRESTOR) 40 MG tablet Take 40 mg by mouth daily.   tamsulosin (FLOMAX) 0.4 MG CAPS capsule Take 1 capsule (0.4 mg total) by mouth daily.   [DISCONTINUED] levothyroxine (SYNTHROID) 150 MCG tablet Take 150 mcg by mouth daily.   No facility-administered encounter medications on file as of 08/04/2023.    Surgical History: Past Surgical History:  Procedure Laterality Date   THYROIDECTOMY     TONSILLECTOMY      Medical History: Past Medical History:  Diagnosis Date   Diabetes mellitus without complication (HCC)    Hypercholesteremia    Hypertension     Family History: Non contributory to the present illness  Social History: Social History   Socioeconomic History   Marital status: Married    Spouse name: Not on file   Number of children: Not on file   Years of education: Not on file   Highest education level: Not on file  Occupational History   Not on file  Tobacco Use   Smoking status: Former   Smokeless tobacco: Never  Vaping Use   Vaping status: Never Used  Substance and Sexual Activity   Alcohol use: Not Currently   Drug use: Never   Sexual activity: Not on file  Other Topics Concern   Not on file  Social History Narrative   Not on file   Social Determinants of Health   Financial Resource Strain: Low Risk  (03/10/2023)   Received from [facility]   Overall Financial Resource Strain (CARDIA)  Difficulty of Paying Living Expenses: Not hard at all  Food Insecurity: No Food Insecurity (03/10/2023)   Received from [facility]   Hunger Vital Sign    Worried About Running Out of Food in the Last Year: Never true    Ran Out of Food in the Last Year: Never true  Transportation Needs: No Transportation Needs (03/10/2023)   Received from [facility] - Transportation    In the past 12 months, has lack of transportation kept you from medical appointments or from getting medications?: No    Lack of  Transportation (Non-Medical): No  Physical Activity: Not on file  Stress: Not on file  Social Connections: Not on file  Intimate Partner Violence: Not on file    Vital Signs: Blood pressure (!) 141/83, pulse 76, resp. rate 16, height 5\' 7"  (1.702 m), weight 223 lb (101.2 kg), SpO2 96%. Body mass index is 34.93 kg/m.    Examination: General Appearance: The patient is well-developed, well-nourished, and in no distress. Neck Circumference: 48 cm Skin: Gross inspection of skin unremarkable. Head: normocephalic, no gross deformities. Eyes: no gross deformities noted. ENT: ears appear grossly normal Neurologic: Alert and oriented. No involuntary movements.  STOP BANG RISK ASSESSMENT S (snore) Have you been told that you snore?     NO   T (tired) Are you often tired, fatigued, or sleepy during the day?   NO  O (obstruction) Do you stop breathing, choke, or gasp during sleep? NO   P (pressure) Do you have or are you being treated for high blood pressure? YES   B (BMI) Is your body index greater than 35 kg/m? NO   A (age) Are you 63 years old or older? YES   N (neck) Do you have a neck circumference greater than 16 inches?   YES   G (gender) Are you a male? YES   TOTAL STOP/BANG "YES" ANSWERS 4       A STOP-Bang score of 2 or less is considered low risk, and a score of 5 or more is high risk for having either moderate or severe OSA. For people who score 3 or 4, doctors may need to perform further assessment to determine how likely they are to have OSA.         EPWORTH SLEEPINESS SCALE:  Scale:  (0)= no chance of dozing; (1)= slight chance of dozing; (2)= moderate chance of dozing; (3)= high chance of dozing  Chance  Situtation    Sitting and reading: 1    Watching TV: 1    Sitting Inactive in public: 0    As a passenger in car: 0      Lying down to rest: 1    Sitting and talking: 0    Sitting quielty after lunch: 0    In a car, stopped in traffic: 0   TOTAL  SCORE:   3 out of 24    SLEEP STUDIES:  HST (10/2021 at [facility]) AHI 31/hr, min SpO2 53%   CPAP COMPLIANCE DATA:  Date Range: 05/01/2023- 07/29/2023  Average Daily Use: 8 hours 55 minutes  Median Use: 8 hours 42 minutes  Compliance for > 4 Hours: 100%  AHI: 0.8 respiratory events per hour  Days Used: 90/90 days  Mask Leak: 31.6  95th Percentile Pressure: 12.3         LABS: No results found for this or any previous visit (from the past 2160 hour(s)).  Radiology:  [place] ELASTOGRAPHY LIVER  Result Date: 07/04/2023 CLINICAL DATA:  HCV EXAM: [place] ELASTOGRAPHY HEPATIC TECHNIQUE: Sonography of the liver was performed. In addition, ultrasound elastography evaluation of the liver was performed. A region of interest was placed within the right lobe of the liver. Following application of a compressive sonographic pulse, tissue compressibility was assessed. Multiple assessments were performed at the selected site. Median tissue compressibility was determined. Previously, hepatic stiffness was assessed by shear wave velocity. Based on recently published Society of Radiologists in Ultrasound consensus article, reporting is now recommended to be performed in the SI units of pressure (kiloPascals) representing hepatic stiffness/elasticity. The obtained result is compared to the published reference standards. (cACLD = compensated Advanced Chronic Liver Disease) COMPARISON:  Abdominal ultrasound 08/06/2018 FINDINGS: Liver: Increased echogenicity of liver parenchyma suggests fatty deposition. No focal abnormality. Portal vein is patent on color Doppler imaging with normal direction of blood flow towards the liver. ULTRASOUND HEPATIC ELASTOGRAPHY Device: Siemens Helix VTQ Patient position: Supine Transducer: 9C2 Number of measurements: 12 Hepatic segment:  8 Median kPa: 6.7 IQR: 1.0 IQR/Median kPa ratio: 0.15 Data quality:  Good Diagnostic category: < or = 9 kPa: in the absence of other known  clinical signs, rules out cACLD The use of hepatic elastography is applicable to patients with viral hepatitis and non-alcoholic fatty liver disease. At this time, there is insufficient data for the referenced cut-off values and use in other causes of liver disease, including alcoholic liver disease. Patients, however, may be assessed by elastography and serve as their own reference standard/baseline. In patients with non-alcoholic liver disease, the values suggesting compensated advanced chronic liver disease (cACLD) may be lower, and patients may need additional testing with elasticity results of 7-9 kPa. Please note that abnormal hepatic elasticity and shear wave velocities may also be identified in clinical settings other than with hepatic fibrosis, such as: acute hepatitis, elevated right heart and central venous pressures including use of beta blockers, veno-occlusive disease (Budd-Chiari), infiltrative processes such as mastocytosis/amyloidosis/infiltrative tumor/lymphoma, extrahepatic cholestasis, with hyperemia in the post-prandial state, and with liver transplantation. Correlation with patient history, laboratory data, and clinical condition recommended. Diagnostic Categories: < or =5 kPa: high probability of being normal < or =9 kPa: in the absence of other known clinical signs, rules out cACLD >9 kPa and ?13 kPa: suggestive of cACLD, but needs further testing >13 kPa: highly suggestive of cACLD > or =17 kPa: highly suggestive of cACLD with an increased probability of clinically significant portal hypertension IMPRESSION: ULTRASOUND LIVER: Coarsening of the hepatic echotexture suggests fatty deposition. ULTRASOUND HEPATIC ELASTOGRAPHY: Median kPa:  6.7 Diagnostic category: < or = 9 kPa: in the absence of other known clinical signs, rules out cACLD Electronically Signed   By: Kennith Center M.D.   On: 07/04/2023 09:47    No results found.  No results found.    Assessment and Plan: Patient Active  Problem List   Diagnosis Date Noted   Essential hypertension 08/04/2023   OSA (obstructive sleep apnea) 08/04/2023   CPAP use counseling 08/04/2023   Severe obstructive sleep apnea 10/25/2021   Elevated LFTs 07/27/2018   High risk medication use 07/06/2018   Acute hepatitis C 07/03/2018   History of thyroid cancer 04/28/2017   Mixed hyperlipidemia 12/22/2013   Type 2 diabetes mellitus with stage 2 chronic kidney disease, without long-term current use of insulin (HCC) 12/22/2013   1. OSA (obstructive sleep apnea) The patient does tolerate PAP and reports  benefit from PAP use. The patient was reminded how  to clean equipment and advised to replace supplies routinely. The patient was also counselled on weight loss. The compliance is excellent. The AHI is 0.8.   OSA on cpap- controlled. Continue with excellent compliance with pap. CPAP continues to be medically necessary to treat this patient's OSA. F/u 69m  2. CPAP use counseling CPAP Counseling: had a lengthy discussion with the patient regarding the importance of PAP therapy in management of the sleep apnea. Patient appears to understand the risk factor reduction and also understands the risks associated with untreated sleep apnea. Patient will try to make a good faith effort to remain compliant with therapy. Also instructed the patient on proper cleaning of the device including the water must be changed daily if possible and use of distilled water is preferred. Patient understands that the machine should be regularly cleaned with appropriate recommended cleaning solutions that do not damage the PAP machine for example given white vinegar and water rinses. Other methods such as ozone treatment may not be as good as these simple methods to achieve cleaning.   3. Essential hypertension  Hypertension Counseling:   The following hypertensive lifestyle modification were recommended and discussed:  1. Limiting alcohol intake to less than 1 oz/day  of ethanol:(24 oz of beer or 8 oz of wine or 2 oz of 100-proof whiskey). 2. Take baby ASA 81 mg daily. 3. Importance of regular aerobic exercise and losing weight. 4. Reduce dietary saturated fat and cholesterol intake for overall cardiovascular health. 5. Maintaining adequate dietary potassium, calcium, and magnesium intake. 6. Regular monitoring of the blood pressure. 7. Reduce sodium intake to less than 100 mmol/day (less than 2.3 gm of sodium or less than 6 gm of sodium choride)      General Counseling: I have discussed the findings of the evaluation and examination with Randy Marshall.  I have also discussed any further diagnostic evaluation thatmay be needed or ordered today. Jazmin verbalizes understanding of the findings of todays visit. We also reviewed his medications today and discussed drug interactions and side effects including but not limited excessive drowsiness and altered mental states. We also discussed that there is always a risk not just to him but also people around him. he has been encouraged to call the office with any questions or concerns that should arise related to todays visit.  No orders of the defined types were placed in this encounter.       I have personally obtained a history, examined the patient, evaluated laboratory and imaging results, formulated the assessment and plan and placed orders. This patient was seen today by Emmaline Kluver, PA-C in collaboration with Dr. Freda Munro.   Yevonne Pax, MD [facility] Diplomate ABMS Pulmonary Critical Care Medicine and Sleep Medicine

## 2023-08-04 NOTE — Patient Instructions (Signed)

## 2023-10-16 ENCOUNTER — Other Ambulatory Visit: Payer: Self-pay | Admitting: Gastroenterology

## 2023-10-16 DIAGNOSIS — B182 Chronic viral hepatitis C: Secondary | ICD-10-CM

## 2023-11-05 ENCOUNTER — Ambulatory Visit
Admission: RE | Admit: 2023-11-05 | Discharge: 2023-11-05 | Disposition: A | Discharge status: Home / Self Care | Payer: Commercial Managed Care - PPO | Source: Ambulatory Visit | Attending: Gastroenterology | Admitting: Gastroenterology

## 2023-11-05 DIAGNOSIS — B182 Chronic viral hepatitis C: Secondary | ICD-10-CM | POA: Diagnosis present

## 2023-11-10 ENCOUNTER — Ambulatory Visit: Payer: Self-pay | Admitting: Nurse Practitioner

## 2023-11-10 ENCOUNTER — Encounter: Payer: Self-pay | Admitting: Nurse Practitioner

## 2023-11-10 DIAGNOSIS — Z113 Encounter for screening for infections with a predominantly sexual mode of transmission: Secondary | ICD-10-CM

## 2023-11-10 LAB — HM HIV SCREENING LAB: HM HIV Screening: NEGATIVE

## 2023-11-10 LAB — HEPATITIS B SURFACE ANTIGEN: Hepatitis B Surface Ag: NONREACTIVE

## 2023-11-10 NOTE — Progress Notes (Signed)
 [facility] Department STI clinic [address], [address] [place] [address] Main phone: [phone]  STI screening visit  Subjective:  Randy Marshall is a 63 y.o. male being seen today for an STI screening visit. The patient reports they do have symptoms.    Patient has the following medical conditions:  Patient Active Problem List   Diagnosis Date Noted   Essential hypertension 08/04/2023   OSA (obstructive sleep apnea) 08/04/2023   CPAP use counseling 08/04/2023   Severe obstructive sleep apnea 10/25/2021   Elevated LFTs 07/27/2018   High risk medication use 07/06/2018   Acute hepatitis C 07/03/2018   History of thyroid cancer 04/28/2017   Mixed hyperlipidemia 12/22/2013   Type 2 diabetes mellitus with stage 2 chronic kidney disease, without long-term current use of insulin (HCC) 12/22/2013    Chief Complaint  Patient presents with   SEXUALLY TRANSMITTED DISEASE   Patient is a pleasant 63 y.o. male who presents to the office today requesting symptomatic STI testing.  Patient indicates a sore on his right lateral tongue appeared about 2 weeks ago. He states it has begun to heal now and that this has never happened before. He reports a current partner telling him they tested positive for BV and he should get tested.  He reports 2 male partners in the last 2 months, and practices penile/vaginal penetrative sex and oral sex. Patient reports using condoms sometimes. Patient indicates a history of HCV but cannot report when he was diagnosed. He is currently on Epclusa for treatment. He reports last sex was 2-3 weeks ago.   STI screening history: Last HIV test per patient/review of record was No results found for: "HMHIVSCREEN" No results found for: "HIV"  Last HEPC test per patient/review of record was No results found for: "HMHEPCSCREEN" No components found for: "HEPC"   Last HEPB test per patient/review of record was No components found for:  "HMHEPBSCREEN"   Fertility: Does the patient or their partner desires a pregnancy in the next year? No  Screening for MPX risk: Does the patient have an unexplained rash? No Is the patient MSM? No Does the patient endorse multiple sex partners or anonymous sex partners? Yes Did the patient have close or sexual contact with a person diagnosed with MPX? No Has the patient traveled outside the [place] where MPX is endemic? No Is there a high clinical suspicion for MPX-- evidenced by one of the following No  -Unlikely to be chickenpox  -Lymphadenopathy  -Rash that present in same phase of evolution on any given body part   See flowsheet for further details and programmatic requirements.   Immunization History  Administered Date(s) Administered   [employer] Vaccination 07/07/2020, 08/05/2020     The following portions of the patient's history were reviewed and updated as appropriate: allergies, current medications, past medical history, past social history, past surgical history and problem list.  Objective:  There were no vitals filed for this visit.  Physical Exam Nursing note reviewed. Exam conducted with a chaperone present Cameron Proud, RN chaperone present).  Constitutional:      Appearance: Normal appearance.  HENT:     Head: Normocephalic.     Salivary Glands: Right salivary gland is not diffusely enlarged or tender. Left salivary gland is not diffusely enlarged or tender.     Mouth/Throat:     Lips: Pink. No lesions.     Mouth: Mucous membranes are moist. No oral lesions.     Tongue: No lesions.  Tongue does not deviate from midline.     Pharynx: Oropharynx is clear. Uvula midline. No oropharyngeal exudate or posterior oropharyngeal erythema.     Tonsils: No tonsillar exudate.      Comments: Area indicated in diagram is location of tongue sore. Too small to measure. Difficult to see, pt pointed out. Slightly lighter color than surrounding mucosal tissue. No  drainage.  Eyes:     General:        Right eye: No discharge.        Left eye: No discharge.     Conjunctiva/sclera:     Right eye: Right conjunctiva is not injected. No exudate.    Left eye: Left conjunctiva is not injected. No exudate. Pulmonary:     Effort: Pulmonary effort is normal.  Genitourinary:    Comments: Patient asymptomatic and declines genital exam.  Lymphadenopathy:     Head:     Right side of head: No submental, submandibular, tonsillar, preauricular or posterior auricular adenopathy.     Left side of head: No submental, submandibular, tonsillar, preauricular or posterior auricular adenopathy.     Cervical: No cervical adenopathy.     Right cervical: No superficial or posterior cervical adenopathy.    Left cervical: No superficial or posterior cervical adenopathy.     Upper Body:     Right upper body: No supraclavicular or axillary adenopathy.     Left upper body: No supraclavicular or axillary adenopathy.  Skin:    General: Skin is warm and dry.     Findings: No lesion or rash.     Comments: Skin tone appropriate for ethnicity. Assessed exposed areas and back only.   Neurological:     Mental Status: He is alert and oriented to person, place, and time.  Psychiatric:        Attention and Perception: Attention and perception normal.        Mood and Affect: Mood and affect normal.        Speech: Speech normal.        Behavior: Behavior normal. Behavior is cooperative.        Thought Content: Thought content normal.     Assessment and Plan:  Randy Marshall is a 63 y.o. male presenting to the [facility] Department for STI screening  1. Screening for venereal disease (Primary) Oral swab testing for gonococcus performed due to sore on patient tongue.  Explained to patient BV is not an STI and he could not have that nor was there a test for that in men.  Did not test for HCV as patient has a known infection and currently on treatment.  - HBV  Antigen/Antibody State Lab - HIV [place] [facility] - Syphilis Serology, [place] [facility] - Gonococcus culture - Chlamydia/GC NAA, Confirmation   Patient does not have STI symptoms Patient accepted all screenings including  oral swab, urine GC/Chlamydia, and blood work for HIV/Syphilis. Patient meets criteria for HepB screening? Yes. Ordered? yes Patient meets criteria for HepC screening? Yes. Ordered? not applicable-patient currently with known HCV diagnosis Recommended condom use with all sex Discussed importance of condom use for STI prevention  Treat positive test results per standing order. Discussed time line for State Lab results and that patient will be called with positive results and encouraged patient to call if he had not heard in 2 weeks Recommended repeat testing in 3 months with positive results. Recommended returning for continued or worsening symptoms.   Return if symptoms worsen or fail  to improve.  No future appointments.  Total time with patient 15 minutes.   Edmonia James, NP

## 2023-11-13 LAB — CHLAMYDIA/GC NAA, CONFIRMATION
Chlamydia trachomatis, NAA: NEGATIVE
Neisseria gonorrhoeae, NAA: NEGATIVE

## 2023-11-15 LAB — GONOCOCCUS CULTURE

## 2023-11-18 ENCOUNTER — Telehealth: Payer: Self-pay

## 2023-11-18 NOTE — Telephone Encounter (Signed)
 LM for pt to return my call.  Berdie Ogren, RN

## 2023-11-21 ENCOUNTER — Telehealth: Payer: Self-pay

## 2023-11-21 NOTE — Telephone Encounter (Signed)
 Left message for pt to return my call.  Berdie Ogren, RN

## 2023-11-24 ENCOUNTER — Ambulatory Visit: Payer: Self-pay

## 2023-11-24 DIAGNOSIS — A539 Syphilis, unspecified: Secondary | ICD-10-CM

## 2023-11-24 MED ORDER — PENICILLIN G BENZATHINE 1200000 UNIT/2ML IM SUSY
2.4000 10*6.[IU] | PREFILLED_SYRINGE | INTRAMUSCULAR | Status: AC
  Administered 2023-11-24 – 2023-12-08 (×3): 2.4 10*6.[IU] via INTRAMUSCULAR

## 2023-11-24 NOTE — Progress Notes (Signed)
 In nurse clinic for Syhpilis tx / Bic #1. Information sheet and pamphlet given to patient. Voices no concerns. Bicillin injections given per order by Leona Singleton, FNP dated 11/24/2023. Tolerated well. Patient walked after injections; no problems. Next injections scheduled for 12/01/2023 at 8:20AM; patient has reminder.   Abagail Kitchens, RN

## 2023-12-01 ENCOUNTER — Ambulatory Visit: Payer: Self-pay

## 2023-12-01 DIAGNOSIS — A539 Syphilis, unspecified: Secondary | ICD-10-CM

## 2023-12-01 NOTE — Progress Notes (Signed)
 In nurse clinic for Bicillin #2 / syphilis tx. Denies problems from last week's bicillin injections. Denies having sex since tx started.   Bicillin 2.38mu/4ml given IM per order by Leona Singleton, FNP dated 11/24/2023. Tolerated injections well. Patient walked in hall approx 10 min after injections, with no problems,  then left for work. Appt scheduled for Bicillin #3 on 12/08/2023 at 8:20 am. Appt card given. Jerel Shepherd, RN

## 2023-12-08 ENCOUNTER — Ambulatory Visit: Payer: Self-pay

## 2023-12-08 DIAGNOSIS — A539 Syphilis, unspecified: Secondary | ICD-10-CM

## 2023-12-08 NOTE — Progress Notes (Signed)
 In nurse clinic for Bicillin #3 to complete syphilis tx.  Reports no problems from injections last week. Denies sex since tx started.   Bicillin 2.56mu/4ml given IM per order by Leona Singleton, FNP dated 11/24/2023. Tolerated injections well today. Stayed for approx 15 min post injections without problem. Repeat RPR due 6 mo (approx 06/2024), has reminder. Jerel Shepherd, RN

## 2024-01-30 NOTE — Progress Notes (Signed)
 [facility] [address] [place], [place] [address]  Pulmonary Sleep Medicine   Office Visit Note  Patient Name: Randy Marshall DOB: [birth year] MRN [id number]    Chief Complaint: Obstructive Sleep Apnea visit  Brief History:  Randy Marshall is seen today for a follow up visit for APAP@ 5-20 cmH2O. The patient has a 2 year history of sleep apnea. Patient is using PAP nightly.  The patient feels rested after sleeping with PAP.  The patient reports benefiting from PAP use. Reported sleepiness is  improved and the Epworth Sleepiness Score is 3 out of 24. The patient does not take naps. The patient complains of the following: pt is in need of supplies.  The compliance download shows 100% compliance with an average use time of 9 hours. The AHI is 0.8.  The patient does not complain of limb movements disrupting sleep. The patient continues to require PAP therapy in order to eliminate sleep apnea.  ROS  General: (-) fever, (-) chills, (-) night sweat Nose and Sinuses: (-) nasal stuffiness or itchiness, (-) postnasal drip, (-) nosebleeds, (-) sinus trouble. Mouth and Throat: (-) sore throat, (-) hoarseness. Neck: (-) swollen glands, (-) enlarged thyroid , (-) neck pain. Respiratory: - cough, - shortness of breath, - wheezing. Neurologic: - numbness, - tingling. Psychiatric: - anxiety, - depression   Current Medication: Outpatient Encounter Medications as of 02/02/2024  Medication Sig   amLODipine (NORVASC) 10 MG tablet Take by mouth.   Cholecalciferol 50 MCG (2000 UT) CAPS Take by mouth.   cyanocobalamin 1000 MCG tablet Take by mouth.   glimepiride (AMARYL) 1 MG tablet Take 1 mg by mouth 2 (two) times daily.   hydrochlorothiazide (MICROZIDE) 12.5 MG capsule Take 12.5 mg by mouth daily.   levothyroxine (SYNTHROID) 137 MCG tablet Take by mouth.   lisinopril (ZESTRIL) 40 MG tablet Take 40 mg by mouth daily.   meloxicam (MOBIC) 7.5 MG tablet Take 7.5 mg by mouth daily.   metFORMIN  (GLUCOPHAGE) 1000 MG tablet Take 1 tablet by mouth 2 (two) times daily.   Omega-3 Fatty Acids (FISH OIL) 1000 MG CAPS Take by mouth.   OZEMPIC, 2 MG/DOSE, 8 MG/3ML SOPN SMARTSIG:0.75 Milliliter(s) SUB-Q Once a Week   rosuvastatin (CRESTOR) 40 MG tablet Take 40 mg by mouth daily.   tamsulosin  (FLOMAX ) 0.4 MG CAPS capsule Take 1 capsule (0.4 mg total) by mouth daily.   No facility-administered encounter medications on file as of 02/02/2024.    Surgical History: Past Surgical History:  Procedure Laterality Date   THYROIDECTOMY     TONSILLECTOMY      Medical History: Past Medical History:  Diagnosis Date   Diabetes mellitus without complication (HCC)    Hypercholesteremia    Hypertension     Family History: Non contributory to the present illness  Social History: Social History   Socioeconomic History   Marital status: Married    Spouse name: Not on file   Number of children: Not on file   Years of education: Not on file   Highest education level: Not on file  Occupational History   Not on file  Tobacco Use   Smoking status: Former   Smokeless tobacco: Never  Vaping Use   Vaping status: Never Used  Substance and Sexual Activity   Alcohol use: Not Currently   Drug use: Not Currently    Comment: When asked about illegal substance use patient stated "maybe previously." Did not state what the substance was.   Sexual activity: Yes  Partners: Female    Birth control/protection: Condom    Comment: condom use sometimes  Other Topics Concern   Not on file  Social History Narrative   Not on file   Social Drivers of Health   Financial Resource Strain: Low Risk  (03/10/2023)   Received from [facility]   Overall Financial Resource Strain (CARDIA)    Difficulty of Paying Living Expenses: Not hard at all  Food Insecurity: No Food Insecurity (03/10/2023)   Received from [facility]   Hunger Vital Sign    Worried About Running Out of Food  in the Last Year: Never true    Ran Out of Food in the Last Year: Never true  Transportation Needs: No Transportation Needs (03/10/2023)   Received from [facility] - Transportation    In the past 12 months, has lack of transportation kept you from medical appointments or from getting medications?: No    Lack of Transportation (Non-Medical): No  Physical Activity: Not on file  Stress: Not on file  Social Connections: Not on file  Intimate Partner Violence: Not on file    Vital Signs: Blood pressure (!) 146/90, pulse 67, resp. rate 16, height 5' 6.5" (1.689 m), weight 222 lb (100.7 kg), SpO2 96%. Body mass index is 35.29 kg/m.    Examination: General Appearance: The patient is well-developed, well-nourished, and in no distress. Neck Circumference: 48 cm Skin: Gross inspection of skin unremarkable. Head: normocephalic, no gross deformities. Eyes: no gross deformities noted. ENT: ears appear grossly normal Neurologic: Alert and oriented. No involuntary movements.  STOP BANG RISK ASSESSMENT S (snore) Have you been told that you snore?     NO   T (tired) Are you often tired, fatigued, or sleepy during the day?   NO  O (obstruction) Do you stop breathing, choke, or gasp during sleep? NO   P (pressure) Do you have or are you being treated for high blood pressure? YES   B (BMI) Is your body index greater than 35 kg/m? NO   A (age) Are you 39 years old or older? YES   N (neck) Do you have a neck circumference greater than 16 inches?   YES   G (gender) Are you a male? YES   TOTAL STOP/BANG "YES" ANSWERS 4       A STOP-Bang score of 2 or less is considered low risk, and a score of 5 or more is high risk for having either moderate or severe OSA. For people who score 3 or 4, doctors may need to perform further assessment to determine how likely they are to have OSA.         EPWORTH SLEEPINESS SCALE:  Scale:  (0)= no chance of dozing; (1)= slight  chance of dozing; (2)= moderate chance of dozing; (3)= high chance of dozing  Chance  Situtation    Sitting and reading: 1    Watching TV: 1    Sitting Inactive in public: 0    As a passenger in car: 0      Lying down to rest: 1    Sitting and talking: 0    Sitting quielty after lunch: 0    In a car, stopped in traffic: 0   TOTAL SCORE:   3 out of 24    SLEEP STUDIES:  HST (10/2021 at [facility]) AHI 31/hr, min SpO2 53%    CPAP COMPLIANCE DATA:  Date Range: 08/03/2023-01/29/2024  Average  Daily Use: 9 hours  Median Use: 8 hours 37 minutes  Compliance for > 4 Hours: 100%  AHI: 0.8 respiratory events per hour  Days Used: 180/180 days  Mask Leak: 32.5  95th Percentile Pressure: 12.4         LABS: Recent Results (from the past 2160 hours)  Hepatitis B surface antigen     Status: None   Collection Time: 11/10/23 12:00 AM  Result Value Ref Range   Hepatitis B Surface Ag Surface Ag/Ab and Total Core AB - Non reactive   HM HIV SCREENING LAB     Status: None   Collection Time: 11/10/23 12:00 AM  Result Value Ref Range   HM HIV Screening Negative - Validated   Gonococcus culture     Status: None   Collection Time: 11/10/23  9:51 AM   Specimen: Pharynx; Throat   PH  Result Value Ref Range   GC Culture Only Final report     Comment: Specimen stability note: A swab transport (ie., ESwab, Amies agar gel) received by the lab more than 24 hours after collection may result in reduced recovery of Neisseria gonorrhoeae (GC). (This is informational only and may not apply to this specimen.)    Result 1 Comment     Comment: No Neisseria gonorrhoeae isolated.  Chlamydia/GC NAA, Confirmation     Status: None   Collection Time: 11/10/23  9:59 AM   Specimen: Urine   UR  Result Value Ref Range   Chlamydia trachomatis, NAA Negative Negative   Neisseria gonorrhoeae, NAA Negative Negative    Radiology: US  Abdomen Complete Result Date:  11/19/2023 CLINICAL DATA:  Chronic hep C EXAM: ABDOMEN ULTRASOUND COMPLETE COMPARISON:  Liver ultrasound June 24, 2023 FINDINGS: Gallbladder: 5 mm polyp in the gallbladder posterior wall. Stable since prior examination. Common bile duct: Diameter: 1.4 mm Liver: Increased echogenicity of the liver without significant nodularity correlates with the patient's history of hepatocellular disease. No parenchymal lesions or biliary dilatation. Stable septated cyst of the left lobe of the liver measuring 1.51.0 cm portal vein is patent on color Doppler imaging with normal direction of blood flow towards the liver. IVC: No abnormality visualized. Pancreas: Partially obscured by bowel gas Spleen: Upper limits of normal 13.6 cm in longitudinal diameter with a volume of 528 mL no parenchymal lesions. Right Kidney: Length: 10.7 cm. Cyst in the upper pole 1.9 x 1.8 x 2.0 cm Left Kidney: Length: 13 cm. Cyst in the upper pole of 2.1 x 1.7 x 1.9 cm Abdominal aorta: 2.4 cm Other findings: None. IMPRESSION: STABLE ECHOGENIC CHANGES OF THE LIVER COMPARED WITH PRIOR EXAMINATION LIVER MAY CORRELATE WITH HEPATOCELLULAR DISEASE.  STABLE GALLBLADDER POLYP: IMPRESSION: STABLE ECHOGENIC CHANGES OF THE LIVER COMPARED WITH PRIOR EXAMINATION LIVER MAY CORRELATE WITH HEPATOCELLULAR DISEASE. STABLE GALLBLADDER POLYP Stable appearance of the spleen in the upper limits of normal. Electronically Signed   By: Fredrich Jefferson M.D.   On: 11/19/2023 19:41    No results found.  No results found.    Assessment and Plan: Patient Active Problem List   Diagnosis Date Noted   Essential hypertension 08/04/2023   OSA (obstructive sleep apnea) 08/04/2023   CPAP use counseling 08/04/2023   Severe obstructive sleep apnea 10/25/2021   Elevated LFTs 07/27/2018   High risk medication use 07/06/2018   Acute hepatitis C 07/03/2018   History of thyroid  cancer 04/28/2017   Mixed hyperlipidemia 12/22/2013   Type 2 diabetes mellitus with stage 2 chronic  kidney disease, without long-term current  use of insulin (HCC) 12/22/2013    1. OSA (obstructive sleep apnea) (Primary) The patient does tolerate PAP and reports  benefit from PAP use. The patient was reminded how to clean equipment and advised to replace supplies routinely. The patient was also counselled on weight loss. The compliance is excellent. The AHI is 0.8.   OSA on cpap- controlled. Continue with excellent compliance with pap. CPAP continues to be medically necessary to treat this patient's OSA. F/u one year.    2. CPAP use counseling CPAP Counseling: had a lengthy discussion with the patient regarding the importance of PAP therapy in management of the sleep apnea. Patient appears to understand the risk factor reduction and also understands the risks associated with untreated sleep apnea. Patient will try to make a good faith effort to remain compliant with therapy. Also instructed the patient on proper cleaning of the device including the water must be changed daily if possible and use of distilled water is preferred. Patient understands that the machine should be regularly cleaned with appropriate recommended cleaning solutions that do not damage the PAP machine for example given white vinegar and water rinses. Other methods such as ozone treatment may not be as good as these simple methods to achieve cleaning.   3. Essential hypertension Controlled on amlodipine and hydrochlorothiazide. . Continue.     General Counseling: I have discussed the findings of the evaluation and examination with Mont Antis.  I have also discussed any further diagnostic evaluation thatmay be needed or ordered today. Luian verbalizes understanding of the findings of todays visit. We also reviewed his medications today and discussed drug interactions and side effects including but not limited excessive drowsiness and altered mental states. We also discussed that there is always a risk not just to him but also people  around him. he has been encouraged to call the office with any questions or concerns that should arise related to todays visit.  No orders of the defined types were placed in this encounter.       I have personally obtained a history, examined the patient, evaluated laboratory and imaging results, formulated the assessment and plan and placed orders. This patient was seen today by Louann Rous, PA-C in collaboration with Dr. Cam Cava.   Cordie Deters, MD [facility] Diplomate ABMS Pulmonary Critical Care Medicine and Sleep Medicine

## 2024-02-02 ENCOUNTER — Ambulatory Visit (INDEPENDENT_AMBULATORY_CARE_PROVIDER_SITE_OTHER): Admitting: Internal Medicine

## 2024-02-02 VITALS — BP 146/90 | HR 67 | Resp 16 | Ht 66.5 in | Wt 222.0 lb

## 2024-02-02 DIAGNOSIS — Z7189 Other specified counseling: Secondary | ICD-10-CM

## 2024-02-02 DIAGNOSIS — I10 Essential (primary) hypertension: Secondary | ICD-10-CM | POA: Diagnosis not present

## 2024-02-02 DIAGNOSIS — G4733 Obstructive sleep apnea (adult) (pediatric): Secondary | ICD-10-CM

## 2024-02-02 NOTE — Patient Instructions (Signed)

## 2024-02-10 ENCOUNTER — Ambulatory Visit (INDEPENDENT_AMBULATORY_CARE_PROVIDER_SITE_OTHER)

## 2024-02-10 ENCOUNTER — Ambulatory Visit: Admission: EM | Admit: 2024-02-10 | Discharge: 2024-02-10 | Disposition: A | Discharge status: Home / Self Care

## 2024-02-10 DIAGNOSIS — M25572 Pain in left ankle and joints of left foot: Secondary | ICD-10-CM

## 2024-02-10 DIAGNOSIS — M25532 Pain in left wrist: Secondary | ICD-10-CM

## 2024-02-10 DIAGNOSIS — S52502A Unspecified fracture of the lower end of left radius, initial encounter for closed fracture: Secondary | ICD-10-CM | POA: Diagnosis not present
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
## 2024-02-10 NOTE — Discharge Instructions (Addendum)
 Your x-rays show degenerative changes in both your wrist and your ankle.  Your ankle x-rays show some old bony fragments but nothing acute.  In your wrist there is a questionable chip of bone off the end of your radius.  Wear the Velcro wrist splint to protect your wrist from any further injury.  You may apply ice to wrist for 20 minutes at a time, 2-3 times a day, to help with pain and inflammation.  You may also take over-the-counter Tylenol  according the package instructions as needed for pain or inflammation.  I have referred you to orthopedics for further evaluation.  I would recommend you wear the wrist brace until after they have evaluated you.
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
## 2024-04-27 ENCOUNTER — Other Ambulatory Visit: Payer: Self-pay | Admitting: Gastroenterology

## 2024-04-27 DIAGNOSIS — B182 Chronic viral hepatitis C: Secondary | ICD-10-CM

## 2024-04-30 ENCOUNTER — Ambulatory Visit
Admission: RE | Admit: 2024-04-30 | Discharge: 2024-04-30 | Disposition: A | Discharge status: Home / Self Care | Source: Ambulatory Visit | Attending: Gastroenterology | Admitting: Gastroenterology

## 2024-04-30 DIAGNOSIS — B182 Chronic viral hepatitis C: Secondary | ICD-10-CM | POA: Insufficient documentation
